# Patient Record
Sex: Male | Born: 1955 | Race: White | Hispanic: No | Marital: Married | Smoking: Current every day smoker
Health system: Southern US, Community
[De-identification: ages and names within clinical notes are randomized; demographics above are authoritative.]

## PROBLEM LIST (undated history)

## (undated) DIAGNOSIS — I10 Essential (primary) hypertension: Secondary | ICD-10-CM

## (undated) DIAGNOSIS — N280 Ischemia and infarction of kidney: Secondary | ICD-10-CM

## (undated) DIAGNOSIS — G459 Transient cerebral ischemic attack, unspecified: Secondary | ICD-10-CM

## (undated) DIAGNOSIS — K55069 Acute infarction of intestine, part and extent unspecified: Secondary | ICD-10-CM

## (undated) DIAGNOSIS — I639 Cerebral infarction, unspecified: Secondary | ICD-10-CM

---

## 1985-08-27 HISTORY — PX: CYST EXCISION: SHX5701

## 2004-08-31 ENCOUNTER — Ambulatory Visit: Payer: Self-pay | Admitting: Family Medicine

## 2004-09-13 ENCOUNTER — Ambulatory Visit: Payer: Self-pay | Admitting: Family Medicine

## 2004-12-19 ENCOUNTER — Ambulatory Visit: Payer: Self-pay | Admitting: Family Medicine

## 2005-10-02 ENCOUNTER — Ambulatory Visit: Payer: Self-pay | Admitting: Family Medicine

## 2005-11-19 ENCOUNTER — Ambulatory Visit: Payer: Self-pay | Admitting: Family Medicine

## 2016-01-25 ENCOUNTER — Inpatient Hospital Stay
Admission: EM | Admit: 2016-01-25 | Payer: Self-pay | Source: Other Acute Inpatient Hospital | Admitting: Family Medicine

## 2016-01-26 DIAGNOSIS — G459 Transient cerebral ischemic attack, unspecified: Secondary | ICD-10-CM

## 2016-01-26 HISTORY — DX: Transient cerebral ischemic attack, unspecified: G45.9

## 2016-01-30 ENCOUNTER — Inpatient Hospital Stay (HOSPITAL_COMMUNITY): Payer: BLUE CROSS/BLUE SHIELD | Admitting: Anesthesiology

## 2016-01-30 ENCOUNTER — Encounter (HOSPITAL_COMMUNITY): Payer: Self-pay | Admitting: *Deleted

## 2016-01-30 ENCOUNTER — Encounter (HOSPITAL_COMMUNITY): Admission: AD | Disposition: E | Payer: Self-pay | Source: Other Acute Inpatient Hospital | Attending: Vascular Surgery

## 2016-01-30 ENCOUNTER — Inpatient Hospital Stay (HOSPITAL_COMMUNITY)
Admission: AD | Admit: 2016-01-30 | Discharge: 2016-02-25 | DRG: 329 | Disposition: E | Payer: BLUE CROSS/BLUE SHIELD | Source: Other Acute Inpatient Hospital | Attending: Vascular Surgery | Admitting: Vascular Surgery

## 2016-01-30 DIAGNOSIS — I739 Peripheral vascular disease, unspecified: Secondary | ICD-10-CM | POA: Diagnosis present

## 2016-01-30 DIAGNOSIS — R601 Generalized edema: Secondary | ICD-10-CM | POA: Diagnosis not present

## 2016-01-30 DIAGNOSIS — R609 Edema, unspecified: Secondary | ICD-10-CM

## 2016-01-30 DIAGNOSIS — I10 Essential (primary) hypertension: Secondary | ICD-10-CM | POA: Diagnosis present

## 2016-01-30 DIAGNOSIS — I9581 Postprocedural hypotension: Secondary | ICD-10-CM | POA: Diagnosis not present

## 2016-01-30 DIAGNOSIS — K55021 Focal (segmental) acute infarction of small intestine: Principal | ICD-10-CM | POA: Diagnosis present

## 2016-01-30 DIAGNOSIS — R4020 Unspecified coma: Secondary | ICD-10-CM | POA: Diagnosis not present

## 2016-01-30 DIAGNOSIS — D735 Infarction of spleen: Secondary | ICD-10-CM | POA: Diagnosis present

## 2016-01-30 DIAGNOSIS — I63343 Cerebral infarction due to thrombosis of bilateral cerebellar arteries: Secondary | ICD-10-CM | POA: Diagnosis not present

## 2016-01-30 DIAGNOSIS — J189 Pneumonia, unspecified organism: Secondary | ICD-10-CM | POA: Diagnosis not present

## 2016-01-30 DIAGNOSIS — I63412 Cerebral infarction due to embolism of left middle cerebral artery: Secondary | ICD-10-CM | POA: Diagnosis not present

## 2016-01-30 DIAGNOSIS — I6522 Occlusion and stenosis of left carotid artery: Secondary | ICD-10-CM | POA: Diagnosis present

## 2016-01-30 DIAGNOSIS — K55059 Acute (reversible) ischemia of intestine, part and extent unspecified: Secondary | ICD-10-CM

## 2016-01-30 DIAGNOSIS — E8770 Fluid overload, unspecified: Secondary | ICD-10-CM | POA: Diagnosis not present

## 2016-01-30 DIAGNOSIS — I749 Embolism and thrombosis of unspecified artery: Secondary | ICD-10-CM | POA: Diagnosis not present

## 2016-01-30 DIAGNOSIS — M7989 Other specified soft tissue disorders: Secondary | ICD-10-CM | POA: Diagnosis not present

## 2016-01-30 DIAGNOSIS — Z6823 Body mass index (BMI) 23.0-23.9, adult: Secondary | ICD-10-CM

## 2016-01-30 DIAGNOSIS — D638 Anemia in other chronic diseases classified elsewhere: Secondary | ICD-10-CM | POA: Diagnosis present

## 2016-01-30 DIAGNOSIS — F17210 Nicotine dependence, cigarettes, uncomplicated: Secondary | ICD-10-CM | POA: Diagnosis present

## 2016-01-30 DIAGNOSIS — Z66 Do not resuscitate: Secondary | ICD-10-CM | POA: Diagnosis not present

## 2016-01-30 DIAGNOSIS — G9340 Encephalopathy, unspecified: Secondary | ICD-10-CM | POA: Diagnosis present

## 2016-01-30 DIAGNOSIS — E43 Unspecified severe protein-calorie malnutrition: Secondary | ICD-10-CM | POA: Diagnosis not present

## 2016-01-30 DIAGNOSIS — E874 Mixed disorder of acid-base balance: Secondary | ICD-10-CM | POA: Diagnosis not present

## 2016-01-30 DIAGNOSIS — I7389 Other specified peripheral vascular diseases: Secondary | ICD-10-CM | POA: Diagnosis not present

## 2016-01-30 DIAGNOSIS — K659 Peritonitis, unspecified: Secondary | ICD-10-CM | POA: Diagnosis present

## 2016-01-30 DIAGNOSIS — D696 Thrombocytopenia, unspecified: Secondary | ICD-10-CM | POA: Diagnosis present

## 2016-01-30 DIAGNOSIS — I639 Cerebral infarction, unspecified: Secondary | ICD-10-CM | POA: Diagnosis present

## 2016-01-30 DIAGNOSIS — N280 Ischemia and infarction of kidney: Secondary | ICD-10-CM | POA: Diagnosis present

## 2016-01-30 DIAGNOSIS — R0989 Other specified symptoms and signs involving the circulatory and respiratory systems: Secondary | ICD-10-CM

## 2016-01-30 DIAGNOSIS — Z823 Family history of stroke: Secondary | ICD-10-CM

## 2016-01-30 DIAGNOSIS — E785 Hyperlipidemia, unspecified: Secondary | ICD-10-CM | POA: Diagnosis present

## 2016-01-30 DIAGNOSIS — K55069 Acute infarction of intestine, part and extent unspecified: Secondary | ICD-10-CM | POA: Diagnosis present

## 2016-01-30 DIAGNOSIS — J9600 Acute respiratory failure, unspecified whether with hypoxia or hypercapnia: Secondary | ICD-10-CM | POA: Diagnosis not present

## 2016-01-30 DIAGNOSIS — R233 Spontaneous ecchymoses: Secondary | ICD-10-CM | POA: Diagnosis not present

## 2016-01-30 DIAGNOSIS — J9601 Acute respiratory failure with hypoxia: Secondary | ICD-10-CM | POA: Insufficient documentation

## 2016-01-30 DIAGNOSIS — E876 Hypokalemia: Secondary | ICD-10-CM | POA: Diagnosis not present

## 2016-01-30 DIAGNOSIS — K551 Chronic vascular disorders of intestine: Secondary | ICD-10-CM | POA: Diagnosis present

## 2016-01-30 DIAGNOSIS — Z419 Encounter for procedure for purposes other than remedying health state, unspecified: Secondary | ICD-10-CM

## 2016-01-30 DIAGNOSIS — Z7982 Long term (current) use of aspirin: Secondary | ICD-10-CM | POA: Diagnosis not present

## 2016-01-30 DIAGNOSIS — D72829 Elevated white blood cell count, unspecified: Secondary | ICD-10-CM | POA: Diagnosis not present

## 2016-01-30 DIAGNOSIS — R4189 Other symptoms and signs involving cognitive functions and awareness: Secondary | ICD-10-CM | POA: Insufficient documentation

## 2016-01-30 DIAGNOSIS — Z8673 Personal history of transient ischemic attack (TIA), and cerebral infarction without residual deficits: Secondary | ICD-10-CM

## 2016-01-30 DIAGNOSIS — J9690 Respiratory failure, unspecified, unspecified whether with hypoxia or hypercapnia: Secondary | ICD-10-CM

## 2016-01-30 DIAGNOSIS — I63443 Cerebral infarction due to embolism of bilateral cerebellar arteries: Secondary | ICD-10-CM | POA: Diagnosis not present

## 2016-01-30 DIAGNOSIS — Z452 Encounter for adjustment and management of vascular access device: Secondary | ICD-10-CM

## 2016-01-30 DIAGNOSIS — Z9289 Personal history of other medical treatment: Secondary | ICD-10-CM

## 2016-01-30 DIAGNOSIS — I63031 Cerebral infarction due to thrombosis of right carotid artery: Secondary | ICD-10-CM | POA: Diagnosis not present

## 2016-01-30 DIAGNOSIS — Z7189 Other specified counseling: Secondary | ICD-10-CM | POA: Diagnosis not present

## 2016-01-30 DIAGNOSIS — R29898 Other symptoms and signs involving the musculoskeletal system: Secondary | ICD-10-CM

## 2016-01-30 DIAGNOSIS — I6529 Occlusion and stenosis of unspecified carotid artery: Secondary | ICD-10-CM | POA: Insufficient documentation

## 2016-01-30 DIAGNOSIS — I6789 Other cerebrovascular disease: Secondary | ICD-10-CM | POA: Diagnosis not present

## 2016-01-30 DIAGNOSIS — R404 Transient alteration of awareness: Secondary | ICD-10-CM | POA: Diagnosis not present

## 2016-01-30 DIAGNOSIS — Z515 Encounter for palliative care: Secondary | ICD-10-CM | POA: Insufficient documentation

## 2016-01-30 DIAGNOSIS — K55019 Acute (reversible) ischemia of small intestine, extent unspecified: Secondary | ICD-10-CM | POA: Diagnosis not present

## 2016-01-30 DIAGNOSIS — R109 Unspecified abdominal pain: Secondary | ICD-10-CM | POA: Diagnosis present

## 2016-01-30 DIAGNOSIS — Z9889 Other specified postprocedural states: Secondary | ICD-10-CM

## 2016-01-30 DIAGNOSIS — M79609 Pain in unspecified limb: Secondary | ICD-10-CM | POA: Diagnosis not present

## 2016-01-30 HISTORY — PX: LAPAROTOMY: SHX154

## 2016-01-30 HISTORY — DX: Cerebral infarction, unspecified: I63.9

## 2016-01-30 HISTORY — DX: Transient cerebral ischemic attack, unspecified: G45.9

## 2016-01-30 HISTORY — DX: Acute infarction of intestine, part and extent unspecified: K55.069

## 2016-01-30 HISTORY — DX: Essential (primary) hypertension: I10

## 2016-01-30 HISTORY — DX: Ischemia and infarction of kidney: N28.0

## 2016-01-30 HISTORY — PX: BOWEL RESECTION: SHX1257

## 2016-01-30 LAB — COMPREHENSIVE METABOLIC PANEL
ALT: 34 U/L (ref 17–63)
AST: 28 U/L (ref 15–41)
Albumin: 2.9 g/dL — ABNORMAL LOW (ref 3.5–5.0)
Alkaline Phosphatase: 70 U/L (ref 38–126)
Anion gap: 10 (ref 5–15)
BUN: 18 mg/dL (ref 6–20)
CO2: 21 mmol/L — ABNORMAL LOW (ref 22–32)
Calcium: 8.7 mg/dL — ABNORMAL LOW (ref 8.9–10.3)
Chloride: 102 mmol/L (ref 101–111)
Creatinine, Ser: 0.86 mg/dL (ref 0.61–1.24)
GFR calc Af Amer: >60 mL/min (ref >60)
GFR calc non Af Amer: >60 mL/min (ref >60)
Glucose, Bld: 108 mg/dL — ABNORMAL HIGH (ref 65–99)
Potassium: 3.6 mmol/L (ref 3.5–5.1)
Sodium: 133 mmol/L — ABNORMAL LOW (ref 135–145)
Total Bilirubin: 0.9 mg/dL (ref 0.3–1.2)
Total Protein: 7.3 g/dL (ref 6.5–8.1)

## 2016-01-30 LAB — CBC
HCT: 36.3 % — ABNORMAL LOW (ref 39.0–52.0)
Hemoglobin: 12.4 g/dL — ABNORMAL LOW (ref 13.0–17.0)
MCH: 30.6 pg (ref 26.0–34.0)
MCHC: 34.2 g/dL (ref 30.0–36.0)
MCV: 89.6 fL (ref 78.0–100.0)
PLATELETS: 241 10*3/uL (ref 150–400)
RBC: 4.05 MIL/uL — AB (ref 4.22–5.81)
RDW: 12.6 % (ref 11.5–15.5)
WBC: 18.3 10*3/uL — AB (ref 4.0–10.5)

## 2016-01-30 LAB — LACTIC ACID, PLASMA: Lactic Acid, Venous: 0.8 mmol/L (ref 0.5–2.0)

## 2016-01-30 SURGERY — LAPAROTOMY, EXPLORATORY
Anesthesia: General | Site: Abdomen

## 2016-01-30 MED ORDER — NITROGLYCERIN IN D5W 200-5 MCG/ML-% IV SOLN
0.0000 ug/min | INTRAVENOUS | Status: AC
  Filled 2016-01-30: qty 250

## 2016-01-30 MED ORDER — DEXMEDETOMIDINE HCL IN NACL 200 MCG/50ML IV SOLN
0.4000 ug/kg/h | INTRAVENOUS | Status: AC
Start: 2016-01-30 — End: 2016-01-31
  Administered 2016-01-31: 0.7 ug/kg/h via INTRAVENOUS
  Filled 2016-01-30: qty 50

## 2016-01-30 MED ORDER — FENTANYL CITRATE (PF) 250 MCG/5ML IJ SOLN
INTRAMUSCULAR | Status: AC
  Filled 2016-01-30: qty 5

## 2016-01-30 MED ORDER — ACETAMINOPHEN 325 MG PO TABS: 650.0000 mg | ORAL_TABLET | Freq: Four times a day (QID) | ORAL | Status: DC | PRN

## 2016-01-30 MED ORDER — SODIUM CHLORIDE 0.9% FLUSH
3.0000 mL | Freq: Two times a day (BID) | INTRAVENOUS | Status: DC
  Administered 2016-01-31: 3 mL via INTRAVENOUS
  Administered 2016-02-02: 10 mL via INTRAVENOUS
  Administered 2016-02-03: 3 mL via INTRAVENOUS
  Administered 2016-02-03: 10 mL via INTRAVENOUS
  Administered 2016-02-04 – 2016-02-08 (×6): 3 mL via INTRAVENOUS

## 2016-01-30 MED ORDER — ASPIRIN 300 MG RE SUPP
300.0000 mg | Freq: Every day | RECTAL | Status: DC
  Administered 2016-01-31 – 2016-02-08 (×8): 300 mg via RECTAL
  Filled 2016-01-30 (×8): qty 1

## 2016-01-30 MED ORDER — SODIUM CHLORIDE 0.9 % IV SOLN
INTRAVENOUS | Status: DC
  Administered 2016-01-30: 22:00:00 via INTRAVENOUS

## 2016-01-30 MED ORDER — ONDANSETRON HCL 4 MG/2ML IJ SOLN: 4.0000 mg | Freq: Four times a day (QID) | INTRAMUSCULAR | Status: DC | PRN

## 2016-01-30 MED ORDER — HYDROMORPHONE HCL 1 MG/ML IJ SOLN
0.5000 mg | INTRAMUSCULAR | Status: DC | PRN
Start: 2016-01-30 — End: 2016-02-04

## 2016-01-30 MED ORDER — MIDAZOLAM HCL 2 MG/2ML IJ SOLN
INTRAMUSCULAR | Status: AC
  Filled 2016-01-30: qty 2

## 2016-01-30 MED ORDER — ONDANSETRON HCL 4 MG PO TABS: 4.0000 mg | ORAL_TABLET | Freq: Four times a day (QID) | ORAL | Status: DC | PRN

## 2016-01-30 MED ORDER — ROCURONIUM BROMIDE 50 MG/5ML IV SOLN
INTRAVENOUS | Status: AC
  Filled 2016-01-30: qty 2

## 2016-01-30 MED ORDER — ARTIFICIAL TEARS OP OINT
TOPICAL_OINTMENT | OPHTHALMIC | Status: AC
  Filled 2016-01-30: qty 7

## 2016-01-30 MED ORDER — PROPOFOL 10 MG/ML IV BOLUS
INTRAVENOUS | Status: AC
  Filled 2016-01-30: qty 20

## 2016-01-30 MED ORDER — ACETAMINOPHEN 650 MG RE SUPP
650.0000 mg | Freq: Four times a day (QID) | RECTAL | Status: DC | PRN
  Administered 2016-02-05 – 2016-02-06 (×2): 650 mg via RECTAL
  Filled 2016-01-30 (×2): qty 1

## 2016-01-30 SURGICAL SUPPLY — 64 items
ATTRACTOMAT 16X20 MAGNETIC DRP (DRAPES) ×4 IMPLANT
BENZOIN TINCTURE PRP APPL 2/3 (GAUZE/BANDAGES/DRESSINGS) ×8 IMPLANT
CANISTER SUCTION 2500CC (MISCELLANEOUS) ×4 IMPLANT
CANISTER WOUND CARE 500ML ATS (WOUND CARE) ×4 IMPLANT
CANNULA VESSEL 3MM 2 BLNT TIP (CANNULA) ×4 IMPLANT
CLIP LIGATING EXTRA MED SLVR (CLIP) ×4 IMPLANT
CLIP LIGATING EXTRA SM BLUE (MISCELLANEOUS) ×4 IMPLANT
CLOSURE WOUND 1/2 X4 (GAUZE/BANDAGES/DRESSINGS)
COVER TABLE BACK 60X90 (DRAPES) ×4 IMPLANT
DRAPE BILATERAL SPLIT (DRAPES) IMPLANT
DRAPE CV SPLIT W-CLR ANES SCRN (DRAPES) ×4 IMPLANT
ELECT BLADE 4.0 EZ CLEAN MEGAD (MISCELLANEOUS) ×4
ELECT REM PT RETURN 9FT ADLT (ELECTROSURGICAL) ×4
ELECTRODE BLDE 4.0 EZ CLN MEGD (MISCELLANEOUS) ×2 IMPLANT
ELECTRODE REM PT RTRN 9FT ADLT (ELECTROSURGICAL) ×2 IMPLANT
GLOVE BIO SURGEON STRL SZ7 (GLOVE) ×4 IMPLANT
GLOVE BIO SURGEON STRL SZ8 (GLOVE) ×4 IMPLANT
GLOVE BIOGEL PI IND STRL 6.5 (GLOVE) ×8 IMPLANT
GLOVE BIOGEL PI IND STRL 7.0 (GLOVE) ×2 IMPLANT
GLOVE BIOGEL PI IND STRL 8 (GLOVE) ×2 IMPLANT
GLOVE BIOGEL PI INDICATOR 6.5 (GLOVE) ×8
GLOVE BIOGEL PI INDICATOR 7.0 (GLOVE) ×2
GLOVE BIOGEL PI INDICATOR 8 (GLOVE) ×2
GLOVE SS BIOGEL STRL SZ 6.5 (GLOVE) ×2 IMPLANT
GLOVE SS BIOGEL STRL SZ 7.5 (GLOVE) ×2 IMPLANT
GLOVE SUPERSENSE BIOGEL SZ 6.5 (GLOVE) ×2
GLOVE SUPERSENSE BIOGEL SZ 7.5 (GLOVE) ×2
GOWN STRL REUS W/ TWL LRG LVL3 (GOWN DISPOSABLE) ×8 IMPLANT
GOWN STRL REUS W/TWL LRG LVL3 (GOWN DISPOSABLE) ×8
INSERT FOGARTY 61MM (MISCELLANEOUS) ×8 IMPLANT
INSERT FOGARTY SM (MISCELLANEOUS) ×8 IMPLANT
KIT BASIN OR (CUSTOM PROCEDURE TRAY) ×4 IMPLANT
KIT ROOM TURNOVER OR (KITS) ×4 IMPLANT
LIGASURE IMPACT 36 18CM CVD LR (INSTRUMENTS) ×4 IMPLANT
NS IRRIG 1000ML POUR BTL (IV SOLUTION) ×8 IMPLANT
PACK AORTA (CUSTOM PROCEDURE TRAY) ×4 IMPLANT
PAD ARMBOARD 7.5X6 YLW CONV (MISCELLANEOUS) ×8 IMPLANT
RELOAD PROXIMATE 75MM BLUE (ENDOMECHANICALS) ×4 IMPLANT
SPONGE ABDOMINAL VAC ABTHERA (MISCELLANEOUS) ×4 IMPLANT
SPONGE LAP 18X18 X RAY DECT (DISPOSABLE) IMPLANT
STAPLER PROXIMATE 75MM BLUE (STAPLE) ×4 IMPLANT
STAPLER VISISTAT 35W (STAPLE) IMPLANT
STRIP CLOSURE SKIN 1/2X4 (GAUZE/BANDAGES/DRESSINGS) IMPLANT
SUCTION POOLE TIP (SUCTIONS) ×4 IMPLANT
SUT ETHIBOND 5 LR DA (SUTURE) IMPLANT
SUT PDS AB 1 TP1 54 (SUTURE) IMPLANT
SUT PROLENE 3 0 SH1 36 (SUTURE) IMPLANT
SUT PROLENE 5 0 C 1 24 (SUTURE) IMPLANT
SUT PROLENE 5 0 C 1 36 (SUTURE) ×8 IMPLANT
SUT SILK 2 0 (SUTURE) ×2
SUT SILK 2 0 SH CR/8 (SUTURE) ×4 IMPLANT
SUT SILK 2 0 TIES 17X18 (SUTURE) ×2
SUT SILK 2-0 18XBRD TIE 12 (SUTURE) ×2 IMPLANT
SUT SILK 2-0 18XBRD TIE BLK (SUTURE) ×2 IMPLANT
SUT SILK 3 0 (SUTURE) ×2
SUT SILK 3 0 TIES 17X18 (SUTURE) ×2
SUT SILK 3-0 18XBRD TIE 12 (SUTURE) ×2 IMPLANT
SUT SILK 3-0 18XBRD TIE BLK (SUTURE) ×2 IMPLANT
SUT VIC AB 2-0 CT1 36 (SUTURE) IMPLANT
SUT VIC AB 3-0 SH 27 (SUTURE)
SUT VIC AB 3-0 SH 27X BRD (SUTURE) IMPLANT
TOWEL BLUE STERILE X RAY DET (MISCELLANEOUS) ×4 IMPLANT
TRAY FOLEY W/METER SILVER 16FR (SET/KITS/TRAYS/PACK) ×4 IMPLANT
WATER STERILE IRR 1000ML POUR (IV SOLUTION) ×8 IMPLANT

## 2016-01-30 NOTE — H&P (Signed)
Brian Baxter EXB:[id number] DOB: [birth year] DOA: 02/14/2016     PCP: Dina Rich, MD   Outpatient Specialists: none  Patient coming from:    home Lives  With family    Chief Complaint: abdominal Pain  HPI: Brian Baxter is a 60 y.o. 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 He has undergone echogram, TEE showing no source of emboli MRI of the pelvis at that time showed no evidence of vascular occlusion. Carotid ultrasound showed bilateral bifurcation plaque but no evidence of hemodynamically significant stenosis, venous Dopplers of lower extremities show no DVT.  Patient was discharge to home on aspirin 81 and Lipitor. Yesterday night on June 4 he developed significant abdominal pain today he presented to [facility] CT A of abdomen and pelvis showed embolus and superior mesenteric artery for possible small bowel ischemia in the left lower quadrant infarcts in the spleen left kidney and possibly in the lower pole of the right kidney. He was transferred to [facility] for interventional radiology consult in order to have TPA administered. He was started on heparin drip and Route to [facility] On arrival to [facility] IR has been consulted but felt that secondary to CVA patient was not a candidate for TPA. At that point vascular surgery was consulted. Plan this point to continue heparin and vascular surgery will evaluate tonight.  Patient is point reports his abdominal pain is tolerable rated at 5 out of 10 he denies any blood in stool or recent bleeding.   Hospitalist was called for admission for acute SMA embolism if possible ischemic bowel,  spleen and kidneys  Review of Systems:    Pertinent positives include: Abdominal pain, lightheadedness  Constitutional:  No weight loss, night sweats, Fevers, chills, fatigue, weight loss  HEENT:  No headaches, Difficulty swallowing,Tooth/dental problems,Sore throat,  No sneezing, itching, ear ache, nasal congestion, post nasal drip,  Cardio-vascular:  No chest pain, Orthopnea, PND, anasarca, dizziness, palpitations.no Bilateral lower extremity swelling  GI:  No heartburn, indigestion,   nausea, vomiting, diarrhea, change in bowel habits, loss of appetite, melena, blood in stool, hematemesis Resp:  no shortness of breath at rest. No dyspnea on exertion, No excess mucus, no productive cough, No non-productive cough, No coughing up of blood.No change in color of mucus.No wheezing. Skin:  no rash or lesions. No jaundice GU:  no dysuria, change in color of urine, no urgency or frequency. No straining to urinate.  No flank pain.  Musculoskeletal:  No joint pain or no joint swelling. No decreased range of motion. No back pain.  Psych:  No change in mood or affect. No depression or anxiety. No memory loss.  Neuro: no localizing neurological complaints, no tingling, no weakness, no double vision, no gait abnormality, no slurred speech, no confusion  As per HPI otherwise 10 point review of systems negative.   Past Medical History: Past Medical History  Diagnosis Date  . Hypertension   . TIA (transient ischemic attack) june 2017  . Renal infarct (HCC) 02/03/2016   Past Surgical History  Procedure Laterality Date  . Cyst excision Left 1987    testicle  Social History:  Ambulatory  independently      reports that he has been smoking Cigarettes.  He has a 30 pack-year smoking history. He has never used smokeless tobacco. He reports that he drinks alcohol. His drug history is not on file.  Allergies:  Allergies not on file     Family History:    Family History  Problem  Relation Age of Onset  . Stroke Father     Medications: Prior to Admission medications   Not on File    Physical Exam: Patient Vitals for the past 24 hrs:  BP Temp Temp src Pulse Resp SpO2 Height  02/15/2016 2040 114/73 mmHg 98.2 F (36.8 C) Oral 86 (!) 24 91 % 5\' 9"  (1.753 m)    1. General:  in No Acute distress 2. Psychological: Alert and  Oriented 3. Head/ENT:   Moist   Mucous Membranes                          Head Non traumatic, neck supple                          Normal  Dentition 4. SKIN:   decreased Skin turgor,  Skin clean Dry and intact no rash 5. Heart: Regular rate and rhythm no  Murmur, Rub or gallop 6. Lungs:  Clear to auscultation bilaterally, no wheezes or crackles   7. Abdomen: Diffusely tender,  distended 8. Lower extremities: no clubbing, cyanosis, or edema 9. Neurologically Grossly intact, moving all 4 extremities equally 10. MSK: Normal range of motion   body mass index is unknown because there is no weight on file.  Labs on Admission:   Labs on Admission: I have personally reviewed following labs and imaging studies  CBC: No results for input(s): WBC, NEUTROABS, HGB, HCT, MCV, PLT in the last 168 hours. Basic Metabolic Panel: No results for input(s): NA, K, CL, CO2, GLUCOSE, BUN, CREATININE, CALCIUM, MG, PHOS in the last 168 hours. GFR: CrCl cannot be calculated (Unknown ideal weight.). Liver Function Tests: No results for input(s): AST, ALT, ALKPHOS, BILITOT, PROT, ALBUMIN in the last 168 hours. No results for input(s): LIPASE, AMYLASE in the last 168 hours. No results for input(s): AMMONIA in the last 168 hours. Coagulation Profile: No results for input(s): INR, PROTIME in the last 168 hours. Cardiac Enzymes: No results for input(s): CKTOTAL, CKMB, CKMBINDEX, TROPONINI in the last 168 hours. BNP (last 3 results) No results for input(s): PROBNP in the last 8760 hours. HbA1C: No results for input(s): HGBA1C in the last 72 hours. CBG: No  results for input(s): GLUCAP in the last 168 hours. Lipid Profile: No results for input(s): CHOL, HDL, LDLCALC, TRIG, CHOLHDL, LDLDIRECT in the last 72 hours. Thyroid Function Tests: No results for input(s): TSH, T4TOTAL, FREET4, T3FREE, THYROIDAB in the last 72 hours. Anemia Panel: No results for input(s): VITAMINB12, FOLATE, FERRITIN, TIBC, IRON, RETICCTPCT in the last 72 hours. Urine analysis: No results found for: COLORURINE, APPEARANCEUR, LABSPEC, PHURINE, GLUCOSEU, HGBUR, BILIRUBINUR, KETONESUR, PROTEINUR, UROBILINOGEN, NITRITE, LEUKOCYTESUR Sepsis Labs: @LABRCNTIP (procalcitonin:4,lacticidven:4) )No results found for this or any previous visit (from the past 240 hour(s)).     Lactic acid 1.0 at outside facility  UA  no evidence of UTI  No results found for: HGBA1C  CrCl cannot be calculated (Unknown ideal weight.).  BNP (last 3 results) No results for input(s): PROBNP in the last 8760 hours.   ECG REPORT outside  facility  Independently reviewed Rate: 75  Rhythm: Normal sinus rhythm ST&T Change: No acute ischemic changes   QTC 402  There were no vitals filed for this visit.   Cultures: No results found for: SDES, SPECREQUEST, CULT, REPTSTATUS   Radiological Exams on Admission: No results found.  Chart has been reviewed    Assessment/Plan  60 y.o. 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 Superior mesenteric artery thrombosis (HCC) -appreciate vascular surgery consult keep patient patient nothing by mouth, follow lactic acid  . Splenic infarction - as per vascular surgery will take to OR, with general surgery on call to OR as well.  . Renal infarct (HCC) - follow creatinine  . Essential hypertension - hold home medications at this point  . CVA (cerebral vascular accident) [facility])  - discuss with neurology we'll continue heparin for now discussed with family disability of increase hemorrhagic conversion but given diffuse embolic events will choose to continue heparin , Aspirin 300 rectally while NPO  . Embolic infarction (HCC) continue heparin    Other plan as per orders.  DVT prophylaxis:  heparin   Code Status:  FULL CODE  as per patient , guarded prognosis  Family Communication:   Family  at  Bedside  plan of care was discussed with   Drayk Humbarger [phone]  Disposition Plan:   likely will need placement for rehabilitation                            Consults called: discussed with  IR, discussed with  Neurology, consulted Vascular Surgery,   Admission status:   inpatient       Level of care     SDU        Yanky Vanderburg 02/02/2016, 10:06 PM    [facility]  Pager [phone]   after 2 AM please page floor coverage PA If 7AM-7PM, please contact the day team taking care of the patient  [email or web address]  Password [email or web address]

## 2016-01-30 NOTE — Consult Note (Signed)
ANTICOAGULATION CONSULT NOTE - Initial Consult  Pharmacy Consult for Heparin Indication: Emboli to SMA/kidney/spleen, acute CVA  Allergies  Allergen Reactions  . Penicillins Hives    Patient Measurements: Height: 5\' 9"  (175.3 cm) Weight: 164 lb 3.9 oz (74.5 kg) IBW/kg (Calculated) : 70.7  Vital Signs: Temp: 98.2 F (36.8 C) (06/05 2040) Temp Source: Oral (06/05 2040) BP: 114/73 mmHg (06/05 2040) Pulse Rate: 86 (06/05 2040)  Labs: No results for input(s): HGB, HCT, PLT, APTT, LABPROT, INR, HEPARINUNFRC, HEPRLOWMOCWT, CREATININE, CKTOTAL, CKMB, TROPONINI in the last 72 hours.  CrCl cannot be calculated (Patient has no serum creatinine result on file.).   Medical History: Past Medical History  Diagnosis Date  . Hypertension   . TIA (transient ischemic attack) june 2017  . Renal infarct (HCC) 02/17/2016  . CVA (cerebral infarction)   . Superior mesenteric artery thrombosis (HCC) 02/07/2016    Medications:  Heparin @ 800 units/hr (from [facility])  Assessment: 59yom found to have [facility] today with abdominal pain. CTA abdomen showed embolus to his superior mesenteric artery with concern for small bowel ischemia, as well as infarcts to his kidney and spleen. Heparin gtt was started ~ 1400 (appears he got a 4000 unit bolus and now running at 800 units/hr). Vascular surgery consult pending.  Goal of Therapy:  Heparin level 0.3-0.5 units/ml Monitor platelets by anticoagulation protocol: Yes   Plan:  1) Check STAT heparin level and adjust accordingly  Fredrik RiggerMarkle, Dymin Dingledine Sue 01/31/2016,10:12 PM

## 2016-01-30 NOTE — Consult Note (Signed)
Patient name: Brian Baxter MRN: [id number] DOB: 23-Mar-1956 Sex: male   Referred by: [facility]    HISTORY OF PRESENT ILLNESS: Patient is very complex 60 year old gentleman presenting with mesenteric ischemia. He had been in his usual state of health when he had a stroke. He was admitted to [facility] on 531 where MRI showed bilateral anterior and posterior circulation strokes. Workup including transesophageal echo showed no evidence of embolic source. He was discharged home on aspirin. He presented [facility] yesterday complaining of worsening abdominal pain. CT scan today revealed infarcts to his left kidney and his spleen. 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 The plan had been to transfer him to [facility] for interventional radiology for lysis of the SMA occlusion. When it was discovered that he had the recent stroke vascular surgery was consult. The patient denies any prior history of chronic mesenteric ischemia symptoms such as postprandial pain or abdominal pain. Does report a possible calf claudication reports that his calves get tired with walking. No prior cardiac history.  Past Medical History  Diagnosis Date  . Hypertension   . TIA (transient ischemic attack) june 2017  . Renal infarct (HCC) 01/26/2016  . CVA (cerebral infarction)   . Superior mesenteric artery thrombosis (HCC) 02/21/2016    Past Surgical History  Procedure Laterality Date  . Cyst excision Left 1987    testicle    Social History   Social History  . Marital Status: Married    Spouse Name: N/A  . Number of Children: N/A  . Years of Education: N/A   Occupational History  . Not on file.   Social History Main Topics  . Smoking status: Current Every Day Smoker -- 1.00 packs/day for 30 years    Types: Cigarettes  . Smokeless tobacco: Never Used  . Alcohol Use: Yes     Comment: monthly  . Drug Use: No  . Sexual Activity: Yes    Birth  Control/ Protection: None   Other Topics Concern  . Not on file   Social History Narrative  . No narrative on file    Family History  Problem Relation Age of Onset  . Stroke Father     Allergies as of 02/02/2016 - Review Complete 02/19/2016  Allergen Reaction Noted  . Penicillins Hives 02/05/2016    No current facility-administered medications on file prior to encounter.   No current outpatient prescriptions on file prior to encounter.     REVIEW OF SYSTEMS: Reviewed in his history and physical with nothing to add   PHYSICAL EXAMINATION:  General: The patient is a well-nourished male, in mild distress due to abdominal pain Vital signs are BP 114/73 mmHg  Pulse 86  Temp(Src) 98.2 F (36.8 C) (Oral)  Resp 24  Ht  (1.753 m)  Wt 164 lb 3.9 oz (74.5 kg)  BMI 24.24 kg/m2  SpO2 91% Pulmonary: There is a good air exchange  Abdomen: Soft but diffusely tender and slightly distended. Tender to palpation in all quadrants and also tender when shaking his hips. Musculoskeletal: There are no major deformities.  There is no significant extremity pain. Neurologic: No focal weakness or paresthesias are detected, Skin: There are no ulcer or rashes noted. Psychiatric: The patient has normal affect. Cardiovascular: There is a regular rate and rhythm without significant murmur appreciated.   CT scan of his abdomen and pelvis was reviewed. 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 Does have thrombosis of his superior mesenteric artery distal to the high-grade proximal stenosis. His aorta and both iliac arteries are extremely calcified as well. He does have infarct in his left kidney and spleen.  Impression and Plan:  Extremely complex problem with a very difficult management possibilities. Not a candidate for endovascular intervention due to the extremely calcified high-grade subtotal occlusions of his proximal superior  mesenteric, celiac and inferior mesenteric artery. Appears that though he has had thrombosis of chronic stenosis in his superior mesenteric artery. Would not be possible to embolize past the second critical stenosis which is chronic. Quite concerned regarding viability of his intestine with a severe abdominal pain. Have consult with Dr. Violeta GelinasBurke Thompson with general surgery as well. Will be taken immediately to the operating room for exploration and hopeful bypass. Explained to the patient and his family and critical nature of this and that this is certainly a life-threatening situation. Unfortunately there is no single source that explains his cerebral infarcts, splenic infarct and left kidney infarct and thrombosis of his spare mesenteric artery. This would most certainly have to be cardiac source but this has been evaluated and no source was noted. Neurology feels he would be too high risk for ongoing heparinization. Would have to be heparinized if bypasses to be undertaken. Explained that at exploration we would revascularize his inferior mesenteric artery and this may require aortobifemoral bypass for ability to have inflow.    Gretta BeganEarly, Milferd Ansell [facility] Office: [phone]

## 2016-01-30 NOTE — Consult Note (Signed)
Reason for Consult:mesenteric ischemia Referring Physician: Caprice Wasko is an 60 y.o. male.  HPI: Jaquari recently suffered bilateral embolic CVA. He underwent workup for that and was treated with aspirin. 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 [facility] with plans for possible interventional radiology lysis.In light of his recent CVA that was not possible. He was seen by Dr. Arbie Cookey from [facility] plans emergent exploration and possible vascular bypass. In light of his abdominal pain and tenderness he has been seen in consultation as there is concern for bowel ischemia.  Past Medical History  Diagnosis Date  . Hypertension   . TIA (transient ischemic attack) june 2017  . Renal infarct (HCC) 01/29/2016  . CVA (cerebral infarction)   . Superior mesenteric artery thrombosis (HCC) 02/19/2016    Past Surgical History  Procedure Laterality Date  . Cyst excision Left 1987    testicle    Family History  Problem Relation Age of Onset  . Stroke Father     Social History:  reports that he has been smoking Cigarettes.  He has a 30 pack-year smoking history. He has never used smokeless tobacco. He reports that he drinks alcohol. He reports that he does not use illicit drugs.  Allergies:  Allergies  Allergen Reactions  . Penicillins Hives    Medications:  Scheduled: . aspirin  300 mg Rectal Daily  . sodium chloride flush  3 mL Intravenous Q12H   Continuous: . sodium chloride 100 mL/hr at 02/05/2016 2200   GUY:[id number] **OR** acetaminophen, HYDROmorphone (DILAUDID) injection, ondansetron **OR** ondansetron (ZOFRAN) IV  Results for orders placed or performed during the hospital encounter of 02/20/2016 (from the past 48 hour(s))  CBC     Status: Abnormal   Collection Time: 01/31/2016 10:07 PM   Result Value Ref Range   WBC 18.3 (H) 4.0 - 10.5 K/uL   RBC 4.05 (L) 4.22 - 5.81 MIL/uL   Hemoglobin 12.4 (L) 13.0 - 17.0 g/dL   HCT 75.6 (L) 43.3 - 29.5 %   MCV 89.6 78.0 - 100.0 fL   MCH 30.6 26.0 - 34.0 pg   MCHC 34.2 30.0 - 36.0 g/dL   RDW 18.8 41.6 - 60.6 %   Platelets 241 150 - 400 K/uL    No results found.  Review of Systems  Constitutional: Negative for fever.  Eyes: Negative.   Respiratory: Negative for cough and shortness of breath.   Cardiovascular: Negative for chest pain and palpitations.  Gastrointestinal: Positive for nausea and abdominal pain. Negative for vomiting and diarrhea.  Genitourinary: Negative.   Musculoskeletal: Negative.   Skin: Negative.   Neurological: Negative for headaches.       Recent CVA  Endo/Heme/Allergies: Negative.   Psychiatric/Behavioral: Negative.    Blood pressure 114/73, pulse 86, temperature 98.2 F (36.8 C), temperature source Oral, resp. rate 24, height  (1.753 m), weight 74.5 kg (164 lb 3.9 oz), SpO2 91 %. Physical Exam  Constitutional: He appears well-developed. No distress.  HENT:  Head: Normocephalic and atraumatic.  Right Ear: External ear normal.  Left Ear: External ear normal.  Mouth/Throat: Oropharynx is clear and moist.  Neck: Neck supple. No tracheal deviation present.  Cardiovascular: Normal rate.   occasional ectopy  Respiratory: Effort normal. No stridor. No respiratory distress. He has no wheezes.  GI: Soft. He exhibits distension. There  is tenderness. There is guarding. There is no rebound.  Moderate distention with significant tenderness and voluntary guarding, no clear peritonitis  Musculoskeletal: He exhibits no tenderness.  Neurological: He is alert.  Speech fluent  Skin: Skin is warm.    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 Dr. Arbie CookeyEarly plans exploration with possible vascular bypass,  possible aortobifemoral bypass. I will join him in the operating room for evaluation of the intestines. I am concerned there is significant intestinal ischemia and he may need bowel resection. He may also require a staged procedure with temporary open abdomen.I discussed my portion of the procedure with the family and they have already signed consent with Dr. Arbie CookeyEarly. I answered their questions.  Cherrell Maybee E 02/12/2016, 11:09 PM

## 2016-01-30 NOTE — Progress Notes (Addendum)
Pt transported to holding VSS, family in waiting room

## 2016-01-31 ENCOUNTER — Encounter (HOSPITAL_COMMUNITY): Payer: Self-pay | Admitting: Vascular Surgery

## 2016-01-31 ENCOUNTER — Inpatient Hospital Stay (HOSPITAL_COMMUNITY): Payer: BLUE CROSS/BLUE SHIELD

## 2016-01-31 DIAGNOSIS — R109 Unspecified abdominal pain: Secondary | ICD-10-CM | POA: Diagnosis present

## 2016-01-31 DIAGNOSIS — I63443 Cerebral infarction due to embolism of bilateral cerebellar arteries: Secondary | ICD-10-CM

## 2016-01-31 DIAGNOSIS — I749 Embolism and thrombosis of unspecified artery: Secondary | ICD-10-CM

## 2016-01-31 DIAGNOSIS — K55019 Acute (reversible) ischemia of small intestine, extent unspecified: Secondary | ICD-10-CM

## 2016-01-31 LAB — CBC
HCT: 34.2 % — ABNORMAL LOW (ref 39.0–52.0)
HCT: 32.7 % — ABNORMAL LOW (ref 39.0–52.0)
HCT: 32.6 % — ABNORMAL LOW (ref 39.0–52.0)
HCT: 32.7 % — ABNORMAL LOW (ref 39.0–52.0)
HEMOGLOBIN: 11 g/dL — AB (ref 13.0–17.0)
Hemoglobin: 11.6 g/dL — ABNORMAL LOW (ref 13.0–17.0)
Hemoglobin: 11.1 g/dL — ABNORMAL LOW (ref 13.0–17.0)
Hemoglobin: 10.9 g/dL — ABNORMAL LOW (ref 13.0–17.0)
MCH: 30.3 pg (ref 26.0–34.0)
MCH: 30.3 pg (ref 26.0–34.0)
MCH: 30.5 pg (ref 26.0–34.0)
MCH: 30.2 pg (ref 26.0–34.0)
MCHC: 33.9 g/dL (ref 30.0–36.0)
MCHC: 33.9 g/dL (ref 30.0–36.0)
MCHC: 33.4 g/dL (ref 30.0–36.0)
MCHC: 33.6 g/dL (ref 30.0–36.0)
MCV: 89.3 fL (ref 78.0–100.0)
MCV: 89.3 fL (ref 78.0–100.0)
MCV: 91.3 fL (ref 78.0–100.0)
MCV: 89.8 fL (ref 78.0–100.0)
PLATELETS: 224 10*3/uL (ref 150–400)
PLATELETS: 216 10*3/uL (ref 150–400)
PLATELETS: 193 10*3/uL (ref 150–400)
PLATELETS: 196 10*3/uL (ref 150–400)
RBC: 3.83 MIL/uL — ABNORMAL LOW (ref 4.22–5.81)
RBC: 3.66 MIL/uL — AB (ref 4.22–5.81)
RBC: 3.57 MIL/uL — AB (ref 4.22–5.81)
RBC: 3.64 MIL/uL — AB (ref 4.22–5.81)
RDW: 12.6 % (ref 11.5–15.5)
RDW: 12.7 % (ref 11.5–15.5)
RDW: 12.8 % (ref 11.5–15.5)
RDW: 12.8 % (ref 11.5–15.5)
WBC: 17 10*3/uL — AB (ref 4.0–10.5)
WBC: 16.4 10*3/uL — ABNORMAL HIGH (ref 4.0–10.5)
WBC: 13.5 10*3/uL — ABNORMAL HIGH (ref 4.0–10.5)
WBC: 14.8 10*3/uL — ABNORMAL HIGH (ref 4.0–10.5)

## 2016-01-31 LAB — BLOOD GAS, ARTERIAL
ACID-BASE DEFICIT: 3.8 mmol/L — AB (ref 0.0–2.0)
Bicarbonate: 20.7 mEq/L (ref 20.0–24.0)
DRAWN BY: 437071
FIO2: 0.8
MECHVT: 570 mL
O2 SAT: 99.1 %
PATIENT TEMPERATURE: 98.6
PCO2 ART: 37.5 mmHg (ref 35.0–45.0)
PEEP: 5 cmH2O
PH ART: 7.361 (ref 7.350–7.450)
RATE: 16 resp/min
TCO2: 21.9 mmol/L (ref 0–100)
pO2, Arterial: 219 mmHg — ABNORMAL HIGH (ref 80.0–100.0)

## 2016-01-31 LAB — BASIC METABOLIC PANEL
ANION GAP: 9 (ref 5–15)
BUN: 18 mg/dL (ref 6–20)
CALCIUM: 8.2 mg/dL — AB (ref 8.9–10.3)
CO2: 21 mmol/L — ABNORMAL LOW (ref 22–32)
Chloride: 104 mmol/L (ref 101–111)
Creatinine, Ser: 0.97 mg/dL (ref 0.61–1.24)
GFR calc Af Amer: >60 mL/min (ref >60)
GLUCOSE: 128 mg/dL — AB (ref 65–99)
Potassium: 3.3 mmol/L — ABNORMAL LOW (ref 3.5–5.1)
Sodium: 134 mmol/L — ABNORMAL LOW (ref 135–145)

## 2016-01-31 LAB — COMPREHENSIVE METABOLIC PANEL
ALBUMIN: 2.5 g/dL — AB (ref 3.5–5.0)
ALK PHOS: 59 U/L (ref 38–126)
ALT: 32 U/L (ref 17–63)
AST: 25 U/L (ref 15–41)
Anion gap: 9 (ref 5–15)
BUN: 17 mg/dL (ref 6–20)
CHLORIDE: 103 mmol/L (ref 101–111)
CO2: 22 mmol/L (ref 22–32)
CREATININE: 0.82 mg/dL (ref 0.61–1.24)
Calcium: 8.1 mg/dL — ABNORMAL LOW (ref 8.9–10.3)
GFR calc non Af Amer: >60 mL/min (ref >60)
GLUCOSE: 138 mg/dL — AB (ref 65–99)
Potassium: 4.2 mmol/L (ref 3.5–5.1)
SODIUM: 134 mmol/L — AB (ref 135–145)
Total Bilirubin: 0.5 mg/dL (ref 0.3–1.2)
Total Protein: 6 g/dL — ABNORMAL LOW (ref 6.5–8.1)

## 2016-01-31 LAB — MAGNESIUM: MAGNESIUM: 2 mg/dL (ref 1.7–2.4)

## 2016-01-31 LAB — PROTIME-INR
INR: 1.18 (ref 0.00–1.49)
INR: 1.25 (ref 0.00–1.49)
Prothrombin Time: 15.2 seconds (ref 11.6–15.2)
Prothrombin Time: 15.8 seconds — ABNORMAL HIGH (ref 11.6–15.2)

## 2016-01-31 LAB — ANTITHROMBIN III: ANTITHROMB III FUNC: 85 % (ref 75–120)

## 2016-01-31 LAB — PHOSPHORUS: Phosphorus: 3 mg/dL (ref 2.5–4.6)

## 2016-01-31 LAB — AMYLASE: AMYLASE: 116 U/L — AB (ref 28–100)

## 2016-01-31 LAB — APTT: aPTT: 32 seconds (ref 24–37)

## 2016-01-31 LAB — TSH: TSH: 1.388 u[IU]/mL (ref 0.350–4.500)

## 2016-01-31 LAB — LACTIC ACID, PLASMA: Lactic Acid, Venous: 0.9 mmol/L (ref 0.5–2.0)

## 2016-01-31 LAB — ABO/RH: ABO/RH(D): A POS

## 2016-01-31 LAB — MRSA PCR SCREENING: MRSA by PCR: NEGATIVE

## 2016-01-31 LAB — HEPARIN LEVEL (UNFRACTIONATED): Heparin Unfractionated: <0.1 IU/mL — ABNORMAL LOW (ref 0.30–0.70)

## 2016-01-31 MED ORDER — PROPOFOL 10 MG/ML IV BOLUS
INTRAVENOUS | Status: DC | PRN
  Administered 2016-01-30: 65 mg via INTRAVENOUS

## 2016-01-31 MED ORDER — SODIUM CHLORIDE 0.9 % IV SOLN
INTRAVENOUS | Status: DC | PRN
  Administered 2016-01-31: 500 mL

## 2016-01-31 MED ORDER — POTASSIUM CHLORIDE CRYS ER 20 MEQ PO TBCR: 20.0000 meq | EXTENDED_RELEASE_TABLET | Freq: Every day | ORAL | Status: DC | PRN

## 2016-01-31 MED ORDER — GUAIFENESIN-DM 100-10 MG/5ML PO SYRP: 15.0000 mL | ORAL_SOLUTION | ORAL | Status: DC | PRN

## 2016-01-31 MED ORDER — FAMOTIDINE IN NACL 20-0.9 MG/50ML-% IV SOLN
20.0000 mg | Freq: Two times a day (BID) | INTRAVENOUS | Status: AC
  Administered 2016-01-31 – 2016-02-05 (×11): 20 mg via INTRAVENOUS
  Filled 2016-01-31 (×11): qty 50

## 2016-01-31 MED ORDER — CEFAZOLIN SODIUM 1 G IJ SOLR
INTRAMUSCULAR | Status: AC
  Filled 2016-01-31: qty 20

## 2016-01-31 MED ORDER — DOCUSATE SODIUM 100 MG PO CAPS
100.0000 mg | ORAL_CAPSULE | Freq: Every day | ORAL | Status: DC
  Filled 2016-01-31: qty 1

## 2016-01-31 MED ORDER — PHENOL 1.4 % MT LIQD: 1.0000 | OROMUCOSAL | Status: DC | PRN

## 2016-01-31 MED ORDER — CEFAZOLIN SODIUM-DEXTROSE 2-3 GM-% IV SOLR
INTRAVENOUS | Status: DC | PRN
Start: 2016-01-31 — End: 2016-01-31
  Administered 2016-01-31: 2 g via INTRAVENOUS

## 2016-01-31 MED ORDER — HYDRALAZINE HCL 20 MG/ML IJ SOLN: 5.0000 mg | INTRAMUSCULAR | Status: DC | PRN

## 2016-01-31 MED ORDER — ALUM & MAG HYDROXIDE-SIMETH 200-200-20 MG/5ML PO SUSP: 15.0000 mL | ORAL | Status: DC | PRN

## 2016-01-31 MED ORDER — LACTATED RINGERS IV SOLN
INTRAVENOUS | Status: DC | PRN
  Administered 2016-01-30: via INTRAVENOUS

## 2016-01-31 MED ORDER — ROCURONIUM 10MG/ML (10ML) SYRINGE FOR MEDFUSION PUMP - OPTIME
INTRAVENOUS | Status: DC | PRN
  Administered 2016-01-31: 20 mg via INTRAVENOUS

## 2016-01-31 MED ORDER — FENTANYL BOLUS VIA INFUSION
50.0000 ug | INTRAVENOUS | Status: DC | PRN
  Administered 2016-01-31: 50 ug via INTRAVENOUS
  Filled 2016-01-31: qty 50

## 2016-01-31 MED ORDER — FENTANYL CITRATE (PF) 250 MCG/5ML IJ SOLN
INTRAMUSCULAR | Status: DC | PRN
  Administered 2016-01-30: 150 ug via INTRAVENOUS
  Administered 2016-01-31: 100 ug via INTRAVENOUS
  Administered 2016-01-31 (×2): 150 ug via INTRAVENOUS
  Administered 2016-01-31 (×2): 100 ug via INTRAVENOUS

## 2016-01-31 MED ORDER — ANTISEPTIC ORAL RINSE SOLUTION (CORINZ)
7.0000 mL | Freq: Four times a day (QID) | OROMUCOSAL | Status: DC
  Administered 2016-01-31 – 2016-02-06 (×24): 7 mL via OROMUCOSAL

## 2016-01-31 MED ORDER — METOPROLOL TARTRATE 5 MG/5ML IV SOLN
2.0000 mg | INTRAVENOUS | Status: DC | PRN
  Filled 2016-01-31: qty 5

## 2016-01-31 MED ORDER — PHENYLEPHRINE HCL 10 MG/ML IJ SOLN
30.0000 ug/min | INTRAMUSCULAR | Status: DC
  Administered 2016-02-01 (×2): 50 ug/min via INTRAVENOUS
  Administered 2016-02-02: 40 ug/min via INTRAVENOUS
  Administered 2016-02-02: 20 ug/min via INTRAVENOUS
  Administered 2016-02-02: 50 ug/min via INTRAVENOUS
  Administered 2016-02-03: 30 ug/min via INTRAVENOUS
  Administered 2016-02-04: 20 ug/min via INTRAVENOUS
  Filled 2016-01-31 (×10): qty 1

## 2016-01-31 MED ORDER — SODIUM CHLORIDE 0.9 % IV SOLN
25.0000 ug/h | INTRAVENOUS | Status: DC
  Administered 2016-01-31: 150 ug/h via INTRAVENOUS
  Administered 2016-01-31: 275 ug/h via INTRAVENOUS
  Administered 2016-01-31: 350 ug/h via INTRAVENOUS
  Administered 2016-02-01 (×2): 400 ug/h via INTRAVENOUS
  Administered 2016-02-01: 350 ug/h via INTRAVENOUS
  Administered 2016-02-01 – 2016-02-02 (×3): 400 ug/h via INTRAVENOUS
  Administered 2016-02-02: 200 ug/h via INTRAVENOUS
  Administered 2016-02-02: 100 ug/h via INTRAVENOUS
  Administered 2016-02-03 – 2016-02-04 (×3): 200 ug/h via INTRAVENOUS
  Administered 2016-02-04: 250 ug/h via INTRAVENOUS
  Filled 2016-01-31 (×15): qty 50

## 2016-01-31 MED ORDER — POTASSIUM CHLORIDE 10 MEQ/50ML IV SOLN
10.0000 meq | INTRAVENOUS | Status: AC
Start: 2016-01-31 — End: 2016-01-31
  Administered 2016-01-31 (×4): 10 meq via INTRAVENOUS
  Filled 2016-01-31 (×4): qty 50

## 2016-01-31 MED ORDER — SUCCINYLCHOLINE CHLORIDE 20 MG/ML IJ SOLN
INTRAMUSCULAR | Status: DC | PRN
  Administered 2016-01-30: 120 mg via INTRAVENOUS

## 2016-01-31 MED ORDER — MIDAZOLAM HCL 2 MG/2ML IJ SOLN
INTRAMUSCULAR | Status: DC | PRN
  Administered 2016-01-30: 2 mg via INTRAVENOUS

## 2016-01-31 MED ORDER — ALBUTEROL SULFATE (2.5 MG/3ML) 0.083% IN NEBU: 2.5000 mg | INHALATION_SOLUTION | RESPIRATORY_TRACT | Status: DC | PRN

## 2016-01-31 MED ORDER — PANTOPRAZOLE SODIUM 40 MG PO TBEC
40.0000 mg | DELAYED_RELEASE_TABLET | Freq: Every day | ORAL | Status: DC
  Filled 2016-01-31 (×2): qty 1

## 2016-01-31 MED ORDER — 0.9 % SODIUM CHLORIDE (POUR BTL) OPTIME
TOPICAL | Status: DC | PRN
  Administered 2016-01-31: 3000 mL

## 2016-01-31 MED ORDER — SODIUM CHLORIDE 0.9 % IV SOLN
1.0000 g | INTRAVENOUS | Status: DC
  Administered 2016-01-31 – 2016-02-08 (×9): 1 g via INTRAVENOUS
  Filled 2016-01-31 (×9): qty 1

## 2016-01-31 MED ORDER — LABETALOL HCL 5 MG/ML IV SOLN
10.0000 mg | INTRAVENOUS | Status: DC | PRN
  Administered 2016-02-03: 10 mg via INTRAVENOUS
  Filled 2016-01-31: qty 4

## 2016-01-31 MED ORDER — CHLORHEXIDINE GLUCONATE 0.12% ORAL RINSE (MEDLINE KIT)
15.0000 mL | Freq: Two times a day (BID) | OROMUCOSAL | Status: DC
  Administered 2016-01-31 – 2016-02-06 (×14): 15 mL via OROMUCOSAL

## 2016-01-31 MED ORDER — FENTANYL CITRATE (PF) 100 MCG/2ML IJ SOLN
50.0000 ug | Freq: Once | INTRAMUSCULAR | Status: AC
Start: 2016-01-31 — End: 2016-01-31
  Administered 2016-01-31: 50 ug via INTRAVENOUS
  Filled 2016-01-31: qty 2

## 2016-01-31 MED ORDER — MIDAZOLAM HCL 2 MG/2ML IJ SOLN
2.0000 mg | INTRAMUSCULAR | Status: DC | PRN
  Administered 2016-01-31 – 2016-02-06 (×23): 2 mg via INTRAVENOUS
  Filled 2016-01-31 (×26): qty 2

## 2016-01-31 MED ORDER — FENTANYL CITRATE (PF) 250 MCG/5ML IJ SOLN
INTRAMUSCULAR | Status: AC
  Filled 2016-01-31: qty 5

## 2016-01-31 MED ORDER — LIDOCAINE HCL (CARDIAC) 20 MG/ML IV SOLN
INTRAVENOUS | Status: DC | PRN
  Administered 2016-01-30: 40 mg via INTRATRACHEAL

## 2016-01-31 MED ORDER — SODIUM CHLORIDE 0.9 % IV SOLN
500.0000 mL | Freq: Once | INTRAVENOUS | Status: AC | PRN
  Administered 2016-02-02: 500 mL via INTRAVENOUS

## 2016-01-31 MED ORDER — MIDAZOLAM HCL 2 MG/2ML IJ SOLN
2.0000 mg | INTRAMUSCULAR | Status: AC | PRN
  Administered 2016-01-31 – 2016-02-01 (×3): 2 mg via INTRAVENOUS
  Filled 2016-01-31 (×2): qty 2

## 2016-01-31 MED ORDER — MAGNESIUM SULFATE 2 GM/50ML IV SOLN: 2.0000 g | Freq: Every day | INTRAVENOUS | Status: DC | PRN

## 2016-01-31 MED ORDER — ROCURONIUM BROMIDE 100 MG/10ML IV SOLN
INTRAVENOUS | Status: DC | PRN
  Administered 2016-01-31: 50 mg via INTRAVENOUS
  Administered 2016-01-31: 20 mg via INTRAVENOUS

## 2016-01-31 MED ORDER — KCL IN DEXTROSE-NACL 20-5-0.45 MEQ/L-%-% IV SOLN
INTRAVENOUS | Status: DC
  Administered 2016-01-31: 14:00:00 via INTRAVENOUS
  Administered 2016-01-31 – 2016-02-01 (×3): 125 mL/h via INTRAVENOUS
  Administered 2016-02-01 – 2016-02-02 (×3): via INTRAVENOUS
  Filled 2016-01-31 (×12): qty 1000

## 2016-01-31 MED ORDER — DEXTROSE 5 % IV SOLN
2.0000 g | INTRAVENOUS | Status: AC
  Administered 2016-01-31: 2 g via INTRAVENOUS
  Filled 2016-01-31: qty 2

## 2016-01-31 MED FILL — Heparin Sodium (Porcine) 100 Unt/ML in Sodium Chloride 0.45%: INTRAMUSCULAR | Qty: 250 | Status: AC

## 2016-01-31 MED FILL — Heparin Sodium (Porcine) Inj 1000 Unit/ML: INTRAMUSCULAR | Qty: 30 | Status: AC

## 2016-01-31 MED FILL — Sodium Chloride IV Soln 0.9%: INTRAVENOUS | Qty: 3000 | Status: AC

## 2016-01-31 NOTE — Progress Notes (Signed)
PULMONARY / CRITICAL CARE MEDICINE   Name: Brian Baxter MRN: [id number] DOB: 05-Nov-1955    ADMISSION DATE:  02/06/2016 CONSULTATION DATE:  01/31/2016  REFERRING MD:  Dr. Arbie Cookey  CHIEF COMPLAINT:  Abdominal pain  HISTORY OF PRESENT ILLNESS:   60 year old male with a past medical history significant for hypertension and tobacco abuse was admitted to [facility] on 02/18/2016 in the setting of small bowel ischemia and possible right kidney ischemia. He has been admitted to an outside hospital on 01/25/2016 in the setting of visual changes. He was noted on MRI brain to have multiple strokes, one in the right occipital lobe which was the largest. During that hospitalization at the outside hospital he underwent an extensive workup to look for an etiology of embolic clot which was felt to be the cause of his strokes. He had a TTE with contrast which initially was suggestive of a patent foramen ovale but a follow-up TEE on the following day with contrast showed no evidence of right to left shunt. Both studies showed no evidence of vegetation on the valve. He had an MRI of his abdomen which showed that the right iliac vein was slightly compressed by the overlying iliac artery there was no evidence of venous clots. He also had a lower extremity Doppler ultrasound which was negative. He was discharged on a statin and aspirin instructions to quit smoking.  On 01/29/2016 he developed severe abdominal pain and went to another outside hospital and then was transferred to [place] for further evaluation. He was initially accepted by [place] for consideration of TPA infusion by our interventional radiology group but considering his recent strokes vascular and general surgery were consulted for further evaluation.  He was taken emergently to the operating room on 02/24/2016 where he was found to have ischemic bowel near the ileum. Bowel was resected and no re-anastomosis attempts were made, he was left open with a  wound VAC in place. He was transferred to the intensive care unit intubated on a low-dose of Neo-Synephrine.    SUBJECTIVE:  Awake on vent  VITAL SIGNS: BP 111/70 mmHg  Pulse 78  Temp(Src) 98.1 F (36.7 C) (Axillary)  Resp 15  Ht 5\' 9"  (1.753 m)  Wt 171 lb 4.8 oz (77.7 kg)  BMI 25.28 kg/m2  SpO2 97%  HEMODYNAMICS:    VENTILATOR SETTINGS: Vent Mode:  [-] PRVC FiO2 (%):  [50 %-80 %] 50 % Set Rate:  [14 bmp-16 bmp] 14 bmp Vt Set:  [570 mL] 570 mL PEEP:  [5 cmH20] 5 cmH20 Plateau Pressure:  [18 cmH20] 18 cmH20  INTAKE / OUTPUT: I/O last 3 completed shifts: In: 2379.8 [I.V.:2129.8; IV Piggyback:250] Out: 555 [Urine:405; Drains:100; Blood:50]  PHYSICAL EXAMINATION: General:  Sedated on vent, opens eyes to voice, follows commands Neuro:  Opens eyes to voice, follows commands, nods head, moving all 4 extremities HEENT:  Normocephalic/atraumatic, endotracheal tube in place Cardiovascular:  Regular rate and rhythm, no murmurs gallops rubs Lungs:  Clear to auscultation bilaterally with vent supported breaths Abdomen:  No bowel sounds, large wound VAC in place Musculoskeletal:  Normal bulk and tone Skin:  Large wound VAC in place abdomen  LABS:  BMET  Recent Labs Lab 02/17/2016 2207 01/31/16 0230 01/31/16 0505  NA 133* 134* 134*  K 3.6 3.3* 4.2  CL 102 104 103  CO2 21* 21* 22  BUN 18 18 17   CREATININE 0.86 0.97 0.82  GLUCOSE 108* 128* 138*    Electrolytes  Recent Labs Lab 02/16/2016  2207 01/31/16 0230 01/31/16 0505  CALCIUM 8.7* 8.2* 8.1*  MG  --  2.0  --   PHOS  --   --  3.0    CBC  Recent Labs Lab 02/02/2016 2207 01/31/16 0230 01/31/16 0505  WBC 18.3* 17.0* 16.4*  HGB 12.4* 11.6* 11.1*  HCT 36.3* 34.2* 32.7*  PLT 241 224 216    Coag's  Recent Labs Lab 01/31/16 0230 01/31/16 0505  APTT 32  --   INR 1.18 1.25    Sepsis Markers  Recent Labs Lab 02/05/2016 2207 01/31/16 0230  LATICACIDVEN 0.8 0.9    ABG  Recent Labs Lab  01/31/16 0230  PHART 7.361  PCO2ART 37.5  PO2ART 219*    Liver Enzymes  Recent Labs Lab 02/13/2016 2207 01/31/16 0505  AST 28 25  ALT 34 32  ALKPHOS 70 59  BILITOT 0.9 0.5  ALBUMIN 2.9* 2.5*    Cardiac Enzymes No results for input(s): TROPONINI, PROBNP in the last 168 hours.  Glucose No results for input(s): GLUCAP in the last 168 hours.  Imaging Portable Chest Xray  01/31/2016  CLINICAL DATA:  60 year old male with acute respiratory failure and hypoxia. Status post exploratory laparotomy. EXAM: PORTABLE CHEST 1 VIEW COMPARISON:  None FINDINGS: Endotracheal tube with tip approximately 2.3 cm above the carina. Recommend retraction by approximately 3- 4 cm for optimal positioning. An enteric tube is partially visualized coursing to the left in the abdomen. There is a right IJ central venous line with tip over central SVC. There is atelectatic changes at the left lung base. No focal consolidation. No significant pleural effusion. No pneumothorax. Top-normal cardiac size. No acute osseous pathology. IMPRESSION: Endotracheal tube above the carina. Left lung base atelectatic changes. No focal consolidation. No pneumothorax. Electronically Signed   By: Elgie Collard M.D.   On: 01/31/2016 03:29     STUDIES:  01/26/2016 outside hospital TTE: Left atrial enlargement, contrast suggestive of patent foramen ovale 01/26/2016 bilateral carotid ultrasound: No significant stenosis 01/26/2016 ultrasound Doppler legs>  negative for  DVT 01/27/2016 MR angiogram pelvis: Mild compression of the left common iliac vein without significant narrowing or thrombosis 01/27/2016 outside hospital TTE> Normal valves, left atrial enlargement, there is no abnormal contrast to suggest intra-atrial shunting 6/5 CT abdomen OSH> CT angiogram findings worrisome for superior mesenteric artery clot, small bowel ischemia, infarcts to kidney and spleen.  CULTURES: None  ANTIBIOTICS: 01/31/2016  ertapenem  SIGNIFICANT EVENTS: June Fifth 2017 CT angiogram findings worrisome for superior mesenteric artery clot, taken to the operating room where several inches of ileum were resected  LINES/TUBES: 02/19/2016 endotracheal tube  DISCUSSION: This is a 60 year old male who has small bowel ischemia, possible renal infarct in the setting of the superior mesenteric artery clot. This occurs several days after recent hospitalization for stroke and what was felt to be an embolic cause of his stroke. The etiology of his underlying hypercoagulable state is uncertain, is still uncertain if there is one common clot leading to embolic phenomena but the extensive workup performed at the outside hospital was suggest otherwise. At this time he remains critically ill after having emergent bowel resection and he is sedated on the ventilator with plans to go back to the operating room in 48 hours for second look and possible vascular intervention.  ASSESSMENT / PLAN:  PULMONARY A: Mechanical ventilatory support postoperative History of tobacco use P:   Full ventilator support For return to OR in 48 hours 6/8. Keep sedated on vent for now with open  abd. 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 Shock postoperatively, likely sedation related P:  Vascular surgery involved in open abd 6/6 Telemetry monitoring Hold anticoagulation given recent large ischemic stroke Titrate Neo-Synephrine to maintain mean arterial pressure greater than 65, consider change to levophed due to bradycardia Start hypercoagulable workup Continue aspirin if okay by vascular surgery and general surgery Neurology consult  Consider repeat TEE or TTE  RENAL A:   Concern for partial right renal infarct on CT angiogram, normal renal function P:   Monitor BMET and UOP Replace electrolytes as needed   GASTROINTESTINAL A:   Small bowel ischemia secondary to  superior mesenteric artery clot Post laparotomy with open abd P:   Postoperative care for general surgery Nothing by mouth PPI  HEMATOLOGIC A:   Concern for hypercoagulable state Stents of the workup for embolic cause of multiple arterial strokes negative at outside hospital 3-4 days prior to admission P:  Send hypercoagulability panel Consider hematology consult depending on hypercoag results Hold heparin until okay by neurology, presumably June 13 (14 days post stroke)  INFECTIOUS A:   Peritonitis P:   Start Ertapenem  ENDOCRINE A:   No acute  issues P:   Monitor glucose  NEUROLOGIC A:   Recent stroke P:   RASS goal: -3 Heavy sedation while abdomen open Fentanyl gtt Versed prn   FAMILY  - Updates: none bedside on my exam  - Inter-disciplinary family meet or Palliative Care meeting due by:  day 7  Steve Minor ACNP [facility] Pager [phone] till 3 pm If no answer page [phone] 01/31/2016, 7:41 AM  Attending Note:  I have examined patient, reviewed labs, studies and notes. I have discussed the case with S Minor, and I agree with the data and plans as amended above. 60 yo man, intubated and deeply sedated s/p ileum resection from SMA thrombosis. Also w hx CVA's as detailed above. On my eval he is hemodynamically improved, is requiring Fio2 0.40 + PEEP 5. Wakes to voice, then quickly back to sleep. We will plan to continue MV pending trip back to OR for abd closure. Hypercoag panel has been sent and is pending. Will consult hematology. Independent critical care time is 30 minutes.   Levy Pupaobert Gehrig Patras, MD, PhD 01/31/2016, 1:28 PM [facility] [phone] or if no answer [phone]

## 2016-01-31 NOTE — Care Management Note (Signed)
Case Management Note  Patient Details  Name: Regis BillMichael E Kussman MRN: [id number] Date of Birth: July 11, 1956  Subjective/Objective:    s/p Procedure(s): SMALL BOWEL RESECTION (N/A) EXPLORATORY LAPAROTOMY (N/A                Action/Plan: PTA - independent from home with wife.  Current plan is for pt to remain on vent until abd wound closed.  CM will continue to follow for disposition needs   Expected Discharge Date:                  Expected Discharge Plan:  Home w Home Health Services  In-House Referral:     Discharge planning Services  CM Consult  Post Acute Care Choice:    Choice offered to:     DME Arranged:    DME Agency:     HH Arranged:    HH Agency:     Status of Service:  In process, will continue to follow  Medicare Important Message Given:    Date Medicare IM Given:    Medicare IM give by:    Date Additional Medicare IM Given:    Additional Medicare Important Message give by:     If discussed at Long Length of Stay Meetings, dates discussed:    Additional Comments:  Cherylann ParrClaxton, Langston Tuberville S, RN 01/31/2016, 4:04 PM

## 2016-01-31 NOTE — Anesthesia Preprocedure Evaluation (Signed)
Anesthesia Evaluation  Patient identified by MRN, date of birth, ID band Patient awake    Reviewed: Allergy & Precautions, NPO status , Patient's Chart, lab work & pertinent test results  Airway Mallampati: II  TM Distance: >3 FB Neck ROM: Full    Dental  (+) Teeth Intact, Dental Advisory Given   Pulmonary Current Smoker,    breath sounds clear to auscultation       Cardiovascular hypertension,  Rhythm:Regular Rate:Normal     Neuro/Psych    GI/Hepatic   Endo/Other    Renal/GU      Musculoskeletal   Abdominal (+)  Abdomen: tender.    Peds  Hematology   Anesthesia Other Findings   Reproductive/Obstetrics                             Anesthesia Physical Anesthesia Plan  ASA: IV and emergent  Anesthesia Plan: General   Post-op Pain Management:    Induction: Intravenous  Airway Management Planned: Oral ETT  Additional Equipment: Arterial line and CVP  Intra-op Plan:   Post-operative Plan: Post-operative intubation/ventilation  Informed Consent: I have reviewed the patients History and Physical, chart, labs and discussed the procedure including the risks, benefits and alternatives for the proposed anesthesia with the patient or authorized representative who has indicated his/her understanding and acceptance.   Dental advisory given  Plan Discussed with: CRNA and Anesthesiologist  Anesthesia Plan Comments:         Anesthesia Quick Evaluation

## 2016-01-31 NOTE — Op Note (Signed)
    OPERATIVE REPORT  DATE OF SURGERY: 01/31/2016  PATIENT: Brian Baxter, 60 y.o. 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 Violeta GelinasBurke Thompson  PHYSICIAN ASSISTANT: Thomasena Edisollins PA-C  ANESTHESIA:  Gen.  EBL: Minimal ml  Total I/O In: 1400 [I.V.:1400] Out: 200 [Urine:150; Blood:50]  BLOOD ADMINISTERED: None  DRAINS: None  SPECIMEN: Distal ileum  COUNTS CORRECT:  YES  PLAN OF CARE: Surgical intensive care unit   PATIENT DISPOSITION:  PACU - hemodynamically stable  PROCEDURE DETAILS: Patient was taken the operating room where the abdomen and both groins prepped in usual fashion. Incision was made from the level wasn't midline past the umbilicus towards the pubic bone. The abdominal fat was opened with electrocautery. The linea alba was opened with electrocautery as well. The peritoneum was entered and there was some cloudy fluid in the pelvis and segment of distal ileum was frankly necrotic. This was a foul-smelling. There did not appear to be any gross perforation. The remainder of the abdominal examination revealed no evidence of ischemia to the liver, gallbladder, remaining small bowel or large bowel. The aorta at the level of the diaphragm had minimal calcifications education. The infrarenal aorta was extensively calcified as were the iliac vessels.  Decision was made to resect the clearly nonviable bowel and not leave GI continuity with the plan for relook in 48 hours. The bowel resection and VAC application will be dictated as a separate note by Dr. Paula Comptonhompson   Nitzia Perren, M.D. 01/31/2016 2:13 AM

## 2016-01-31 NOTE — Progress Notes (Signed)
Subjective: Interval History: none.. Sedated on the vent. Moves all 4 extremities. Difficult to assess neurologic function. Abdominal VAC functioning well  Objective: Vital signs in last 24 hours: Temp:  [98.1 F (36.7 C)-99 F (37.2 C)] 99 F (37.2 C) (06/06 0700) Pulse Rate:  [54-118] 75 (06/06 1000) Resp:  [12-44] 14 (06/06 1000) BP: (83-165)/(52-109) 121/76 mmHg (06/06 1000) SpO2:  [91 %-100 %] 98 % (06/06 1000) Arterial Line BP: (84-158)/(39-87) 121/55 mmHg (06/06 1000) FiO2 (%):  [50 %-80 %] 50 % (06/06 1000) Weight:  [164 lb 3.9 oz (74.5 kg)-171 lb 4.8 oz (77.7 kg)] 171 lb 4.8 oz (77.7 kg) (06/06 0545)  Intake/Output from previous day: 06/05 0701 - 06/06 0700 In: 2399.8 [I.V.:2129.8; IV Piggyback:250] Out: 555 [Urine:405; Drains:100; Blood:50] Intake/Output this shift: Total I/O In: 555.7 [I.V.:445.7; Other:60; IV Piggyback:50] Out: 150 [Urine:150]  Abdominal VAC place. Seems to be tender over his abdomen. 2+ dorsalis pedis pulses bilaterally  Lab Results:  Recent Labs  01/31/16 0230 01/31/16 0505  WBC 17.0* 16.4*  HGB 11.6* 11.1*  HCT 34.2* 32.7*  PLT 224 216   BMET  Recent Labs  01/31/16 0230 01/31/16 0505  NA 134* 134*  K 3.3* 4.2  CL 104 103  CO2 21* 22  GLUCOSE 128* 138*  BUN 18 17  CREATININE 0.97 0.82  CALCIUM 8.2* 8.1*    Studies/Results: Portable Chest Xray  01/31/2016  CLINICAL DATA:  60 year old male with acute respiratory failure and hypoxia. Status post exploratory laparotomy. EXAM: PORTABLE CHEST 1 VIEW COMPARISON:  None FINDINGS: Endotracheal tube with tip approximately 2.3 cm above the carina. Recommend retraction by approximately 3- 4 cm for optimal positioning. An enteric tube is partially visualized coursing to the left in the abdomen. There is a right IJ central venous line with tip over central SVC. There is atelectatic changes at the left lung base. No focal consolidation. No significant pleural effusion. No pneumothorax. Top-normal  cardiac size. No acute osseous pathology. IMPRESSION: Endotracheal tube above the carina. Left lung base atelectatic changes. No focal consolidation. No pneumothorax. Electronically Signed   By: Elgie CollardArash  Radparvar M.D.   On: 01/31/2016 03:29   Anti-infectives: Anti-infectives    Start     Dose/Rate Route Frequency Ordered Stop   01/31/16 0400  ertapenem (INVANZ) 1 g in sodium chloride 0.9 % 50 mL IVPB     1 g 100 mL/hr over 30 Minutes Intravenous Every 24 hours 01/31/16 0334     01/31/16 0100  [MAR Hold]  cefOXitin (MEFOXIN) 2 g in dextrose 5 % 50 mL IVPB     (MAR Hold since 01/31/16 0049)   2 g 100 mL/hr over 30 Minutes Intravenous To Surgery 01/31/16 0046 01/31/16 0118      Assessment/Plan: s/p Procedure(s): SMALL BOWEL RESECTION (N/A) EXPLORATORY LAPAROTOMY (N/A) Remains hemodynamically stable. Appreciate critical care medicine's input. Will plan return to operating room tomorrow with the hope ability to do saphenous vein bypass to his spare mesenteric artery. This discussed with his son by telephone who is the primary contact person. Also discussed with general surgery plan for return to operating room tomorrow around noon.    LOS: 1 day   Gretta Beganarly, Lovelle Lema 01/31/2016, 10:56 AM

## 2016-01-31 NOTE — Consult Note (Signed)
PULMONARY / CRITICAL CARE MEDICINE   Name: Brian Baxter MRN: [id number] DOB: 1956-04-01    ADMISSION DATE:  02/17/2016 CONSULTATION DATE:  01/31/2016  REFERRING MD:  Dr. Arbie CookeyEarly  CHIEF COMPLAINT:  Abdominal pain  HISTORY OF PRESENT ILLNESS:   60 year old male with a past medical history significant for hypertension and tobacco abuse was admitted to [facility] on 01/29/2016 in the setting of small bowel ischemia and possible right kidney ischemia. He has been admitted to an outside hospital on 01/25/2016 in the setting of visual changes. He was noted on MRI brain to have multiple strokes, one in the right occipital lobe which was the largest. During that hospitalization at the outside hospital he underwent an extensive workup to look for an etiology of embolic clot which was felt to be the cause of his strokes. He had a TTE with contrast which initially was suggestive of a patent foramen ovale but a follow-up TEE on the following day with contrast showed no evidence of right to left shunt. Both studies showed no evidence of vegetation on the valve. He had an MRI of his abdomen which showed that the right iliac vein was slightly compressed by the overlying iliac artery there was no evidence of venous clots. He also had a lower extremity Doppler ultrasound which was negative. He was discharged on a statin and aspirin instructions to quit smoking.  On 01/29/2016 he developed severe abdominal pain and went to another outside hospital and then was transferred to us for further evaluation. He was initially accepted by us for consideration of TPA infusion by our interventional radiology group but considering his recent strokes vascular and general surgery were consulted for further evaluation.  He was taken emergently to the operating room on 02/02/2016 where he was found to have ischemic bowel near the ileum. Bowel was resected and no re-anastomosis attempts were made, he was left open with a  wound VAC in place. He was transferred to the intensive care unit intubated on a low-dose of Neo-Synephrine.  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  PAST SURGICAL HISTORY: He  has past surgical history that includes Cyst excision (Left, 1987).  Allergies  Allergen Reactions  . Penicillins Hives    No current facility-administered medications on file prior to encounter.   No current outpatient prescriptions on file prior to encounter.    FAMILY HISTORY:  His indicated that his father is deceased.   SOCIAL HISTORY: He  reports that he has been smoking Cigarettes.  He has a 30 pack-year smoking history. He has never used smokeless tobacco. He reports that he drinks alcohol. He reports that he does not use illicit drugs.  REVIEW OF SYSTEMS:   Cannot obtain due to intubation  SUBJECTIVE:  As above  VITAL SIGNS: BP 102/63 mmHg  Pulse 85  Temp(Src) 98.2 F (36.8 C) (Oral)  Resp 18  Ht 5\' 9"  (1.753 m)  Wt 74.5 kg (164 lb 3.9 oz)  BMI 24.24 kg/m2  SpO2 100%  HEMODYNAMICS:    VENTILATOR SETTINGS: Vent Mode:  [-] PRVC FiO2 (%):  [80 %] 80 % Vt Set:  [570 mL] 570 mL PEEP:  [5 cmH20] 5 cmH20 Plateau Pressure:  [18 cmH20] 18 cmH20  INTAKE / OUTPUT:    PHYSICAL EXAMINATION: General:  Sedated on vent, opens eyes to voice Neuro:  Opens eyes to voice, follows commands, nods head, moving all 4 extremities HEENT:  Normocephalic/atraumatic, endotracheal tube in place Cardiovascular:  Regular rate and rhythm, no murmurs gallops rubs Lungs:  Clear to auscultation bilaterally with vent supported breaths Abdomen:  No bowel sounds, large wound VAC in place Musculoskeletal:  Normal bulk and tone Skin:  Large wound VAC in place abdomen  LABS:  BMET  Recent Labs Lab 02/22/2016 2207  01/31/16 0230  NA 133* 134*  K 3.6 3.3*  CL 102 104  CO2 21* 21*  BUN 18 18  CREATININE 0.86 0.97  GLUCOSE 108* 128*    Electrolytes  Recent Labs Lab 02/20/2016 2207 01/31/16 0230  CALCIUM 8.7* 8.2*  MG  --  2.0    CBC  Recent Labs Lab 02/05/2016 2207  WBC 18.3*  HGB 12.4*  HCT 36.3*  PLT 241    Coag's No results for input(s): APTT, INR in the last 168 hours.  Sepsis Markers  Recent Labs Lab 02/01/2016 2207 01/31/16 0230  LATICACIDVEN 0.8 0.9    ABG  Recent Labs Lab 01/31/16 0230  PHART 7.361  PCO2ART 37.5  PO2ART 219*    Liver Enzymes  Recent Labs Lab 02/20/2016 2207  AST 28  ALT 34  ALKPHOS 70  BILITOT 0.9  ALBUMIN 2.9*    Cardiac Enzymes No results for input(s): TROPONINI, PROBNP in the last 168 hours.  Glucose No results for input(s): GLUCAP in the last 168 hours.  Imaging No results found.   STUDIES:  01/26/2016 outside hospital TTE: Left atrial enlargement, contrast suggestive of patent foramen ovale 01/26/2016 bilateral carotid ultrasound: No significant stenosis 01/26/2016 ultrasound Doppler legs>  negative for  DVT 01/27/2016 MR angiogram pelvis: Mild compression of the left common iliac vein without significant narrowing or thrombosis 01/27/2016 outside hospital TTE> Normal valves, left atrial enlargement, there is no abnormal contrast to suggest intra-atrial shunting 6/5 CT abdomen OSH> CT angiogram findings worrisome for superior mesenteric artery clot, small bowel ischemia, infarcts to kidney and spleen.  CULTURES: None  ANTIBIOTICS: 01/31/2016 ertapenem  SIGNIFICANT EVENTS: June Fifth 2017 CT angiogram findings worrisome for superior mesenteric artery clot, taken to the operating room where several inches of ileum were resected  LINES/TUBES: 02/13/2016 endotracheal tube  DISCUSSION: This is a 60 year old male who has small bowel ischemia, possible renal infarct in the setting of the superior mesenteric artery clot.  This occurs several days after recent hospitalization for stroke and what was felt to be an embolic cause of his stroke. The etiology of his underlying hypercoagulable state is uncertain, is still uncertain if there is one common clot leading to embolic phenomena but the extensive workup performed at the outside hospital was suggest otherwise. At this time he remains critically ill after having emergent bowel resection and he is sedated on the ventilator with plans to go back to the operating room in 48 hours for second look and possible vascular intervention.  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 Consider repeat TEE or TTE  RENAL A:  Concern for partial right renal infarct on CT angiogram, normal renal function P:   Monitor BMET and UOP Replace electrolytes as needed   GASTROINTESTINAL A:   Small bowel ischemia secondary to superior mesenteric artery clot P:   Postoperative care for general surgery Nothing by mouth PPI  HEMATOLOGIC A:   Concern for hypercoagulable state Stents of the workup for embolic cause of multiple arterial strokes negative at outside hospital 3-4 days prior to admission P:  Send hypercoagulability panel Consider hematology consult Hold heparin until okay by neurology, presumably June 13 (14 days post stroke)  INFECTIOUS A:   Peritonitis P:    Start Ertapenem  ENDOCRINE A:   No acute  issues P:   Monitor glucose  NEUROLOGIC A:   Recent stroke P:   RASS goal: -3 Heavy sedation while abdomen open Fentanyl gtt Versed prn   FAMILY  - Updates: none bedside on my exam  - Inter-disciplinary family meet or Palliative Care meeting due by:  day 7  My cc time 60 minutes  Heber [place], MD [place] [facility] Pager: [phone] Cell: [phone] After 3pm or if no response, call [phone]   01/31/2016, 3:07 AM

## 2016-01-31 NOTE — Anesthesia Procedure Notes (Signed)
Procedure Name: Intubation Date/Time: 01/27/2016 11:45 PM Performed by: Molli HazardGORDON, Myley Bahner M Pre-anesthesia Checklist: Patient identified, Emergency Drugs available, Suction available and Patient being monitored Patient Re-evaluated:Patient Re-evaluated prior to inductionOxygen Delivery Method: Circle system utilized Preoxygenation: Pre-oxygenation with 100% oxygen Intubation Type: IV induction and Rapid sequence Grade View: Grade I Tube type: Subglottic suction tube Tube size: 8.0 mm Number of attempts: 1 Airway Equipment and Method: Stylet Placement Confirmation: ETT inserted through vocal cords under direct vision,  positive ETCO2 and breath sounds checked- equal and bilateral Secured at: 21 cm Tube secured with: Tape Dental Injury: Teeth and Oropharynx as per pre-operative assessment

## 2016-01-31 NOTE — Op Note (Addendum)
01/29/2016 - 01/31/2016  1:59 AM  PATIENT:  Brian Baxter  60 y.o. 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 Bosie HelperEarly's operative note. After his initial exploration, I evaluated the bowel. The small bowel was run from the terminal ileum back to the ligament of Treitz. There was a segment of necrotic small bowel in the distal ileum. This was only about 15 cm in length and had not perforated. It was clearly necrotic. The remainder of the small bowel appeared viable. Right colon, transverse colon, left colon, sigmoid colon, and rectosigmoid also all appeared viable. Decision was made to perform a segmental small bowel resection. The distal ileum was divided proximal and distal to the necrotic area with GIA-75 stapler. The mesentery was then taken down between [facility] clamps and ligated with sutures. Additional suture ligatures were placed to get good hemostasis. The stapled ends were viable. The specimen was sent to pathology. We reinspected the small bowel and it remained viable. Orogastric tube was repositioned in the stomach. Decision was made to temporarily close the abdomen with an open abdomen VAC device and return for second look. The abdomen was irrigated with warm saline. Hemostasis was ensured. The AbThera open abdomen VAC was then applied in standard fashion. The inner  drape was tucked around the bowel completely. Pupils sponges were fashioned on top of that fall by the Vac drapes. This was hooked up to suction and excellent seal was obtained. This completed the procedure. All counts were correct. He was taken directly to the intensive care unit on the ventilator. There were no apparent complications. PATIENT DISPOSITION:  ICU - intubated and critically ill.   Delay start of Pharmacological VTE agent (>24hrs) due to surgical blood loss or risk of bleeding:  no  Violeta GelinasBurke Kariana Wiles, MD, MPH, FACS Pager: [phone]  6/6/20171:59 AM

## 2016-01-31 NOTE — Progress Notes (Signed)
Pharmacy Antibiotic Note  Brian Baxter is a 60 y.o. male admitted on 02/21/2016 with peritonitis.  Pharmacy has been consulted for ertapenem dosing.  Plan: Ertapenem 1g IV Q24H.  Height: 5\' 9"  (175.3 cm) Weight: 164 lb 3.9 oz (74.5 kg) IBW/kg (Calculated) : 70.7  Temp (24hrs), Avg:98.2 F (36.8 C), Min:98.2 F (36.8 C), Max:98.2 F (36.8 C)   Recent Labs Lab 02/12/2016 2207 01/31/16 0230  WBC 18.3* 17.0*  CREATININE 0.86 0.97  LATICACIDVEN 0.8 0.9    Estimated Creatinine Clearance: 82 mL/min (by C-G formula based on Cr of 0.97).    Allergies  Allergen Reactions  . Penicillins Hives     Thank you for allowing pharmacy to be a part of this patient's care.  Brian Baxter, PharmD, BCPS  01/31/2016 3:31 AM

## 2016-01-31 NOTE — Anesthesia Postprocedure Evaluation (Signed)
Anesthesia Post Note  Patient: Brian Baxter  Procedure(s) Performed: Procedure(s) (LRB): SMALL BOWEL RESECTION (N/A) EXPLORATORY LAPAROTOMY (N/A)  Patient location during evaluation: SICU Anesthesia Type: General Level of consciousness: patient remains intubated per anesthesia plan Vital Signs Assessment: post-procedure vital signs reviewed and stable Respiratory status: patient on ventilator - see flowsheet for VS and patient remains intubated per anesthesia plan Cardiovascular status: blood pressure returned to baseline Anesthetic complications: no    Last Vitals:  Filed Vitals:   August 11, 2016 2040  BP: 114/73  Pulse: 86  Temp: 36.8 C  Resp: 24    Last Pain: There were no vitals filed for this visit.               Kaena Santori COKER

## 2016-01-31 NOTE — Progress Notes (Signed)
1 Day Post-Op  Subjective: He is awake on the vent.  Mittens in place.  He is putting out around 50 ml/hr from foley.  Wound vac in place, plan to take him back to OR tomorrow.    Objective: Vital signs in last 24 hours: Temp:  [98.1 F (36.7 C)-99 F (37.2 C)] 99 F (37.2 C) (06/06 0700) Pulse Rate:  [54-118] 75 (06/06 0830) Resp:  [12-44] 16 (06/06 0830) BP: (83-165)/(52-109) 129/72 mmHg (06/06 0830) SpO2:  [91 %-100 %] 98 % (06/06 0830) Arterial Line BP: (84-158)/(39-87) 115/59 mmHg (06/06 0700) FiO2 (%):  [50 %-80 %] 50 % (06/06 0830) Weight:  [74.5 kg (164 lb 3.9 oz)-77.7 kg (171 lb 4.8 oz)] 77.7 kg (171 lb 4.8 oz) (06/06 0545)   2379 IV 405 urine Hypotension is better on Neo K+ replaced, creatinine is stable, WBC is stable  Intake/Output from previous day: 06/05 0701 - 06/06 0700 In: 2379.8 [I.V.:2129.8; IV Piggyback:250] Out: 555 [Urine:405; Drains:100; Blood:50] Intake/Output this shift: Total I/O In: -  Out: 75 [Urine:75]  General appearance: alert and on Vent and in mittens, he triet to pull tubes earlier.   Resp: clear to auscultation bilaterally and anterior exam GI: wound vac in place, no bowel sounds.  Lab Results:   Recent Labs  01/31/16 0230 01/31/16 0505  WBC 17.0* 16.4*  HGB 11.6* 11.1*  HCT 34.2* 32.7*  PLT 224 216    BMET  Recent Labs  01/31/16 0230 01/31/16 0505  NA 134* 134*  K 3.3* 4.2  CL 104 103  CO2 21* 22  GLUCOSE 128* 138*  BUN 18 17  CREATININE 0.97 0.82  CALCIUM 8.2* 8.1*   PT/INR  Recent Labs  01/31/16 0230 01/31/16 0505  LABPROT 15.2 15.8*  INR 1.18 1.25     Recent Labs Lab 02/19/2016 2207 01/31/16 0505  AST 28 25  ALT 34 32  ALKPHOS 70 59  BILITOT 0.9 0.5  PROT 7.3 6.0*  ALBUMIN 2.9* 2.5*     Lipase  No results found for: LIPASE   Studies/Results: Portable Chest Xray  01/31/2016  CLINICAL DATA:  60 year old male with acute respiratory failure and hypoxia. Status post exploratory laparotomy. EXAM:  PORTABLE CHEST 1 VIEW COMPARISON:  None FINDINGS: Endotracheal tube with tip approximately 2.3 cm above the carina. Recommend retraction by approximately 3- 4 cm for optimal positioning. An enteric tube is partially visualized coursing to the left in the abdomen. There is a right IJ central venous line with tip over central SVC. There is atelectatic changes at the left lung base. No focal consolidation. No significant pleural effusion. No pneumothorax. Top-normal cardiac size. No acute osseous pathology. IMPRESSION: Endotracheal tube above the carina. Left lung base atelectatic changes. No focal consolidation. No pneumothorax. Electronically Signed   By: Anner Crete M.D.   On: 01/31/2016 03:29    Medications: . antiseptic oral rinse  7 mL Mouth Rinse QID  . aspirin  300 mg Rectal Daily  . chlorhexidine gluconate (SAGE KIT)  15 mL Mouth Rinse BID  . [START ON 02/11/2016] docusate sodium  100 mg Oral Daily  . ertapenem  1 g Intravenous Q24H  . famotidine (PEPCID) IV  20 mg Intravenous Q12H  . nitroGLYCERIN  0-200 mcg/min Intravenous To OR  . pantoprazole  40 mg Oral Daily  . sodium chloride flush  3 mL Intravenous Q12H   . dextrose 5 % and 0.45 % NaCl with KCl 20 mEq/L 125 mL/hr at 01/31/16 0700  . fentaNYL infusion  INTRAVENOUS 250 mcg/hr (01/31/16 0754)  . 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 Lavone Neri Thompson/Dr. Sherren Mocha Early Post op hypotension CVA 01/25/16 at [facility] Hx of Hypertension Renal infarct FEN: NPO/IV fluids  ID:  Day 2 ertapenem DVT:  SCD only    Plan:  Continue support, we plan to take him back to the OR tomorrow.  Dr. Brantley Stage with discuss with family later today.   LOS: 1 day    Ameera Tigue 01/31/2016 [phone]

## 2016-01-31 NOTE — Progress Notes (Signed)
Initial Nutrition Assessment  DOCUMENTATION CODES:   Not applicable  INTERVENTION:    Recommend nutrition support initiation within next 24-48 hours (EN vs TPN)  NUTRITION DIAGNOSIS:   Inadequate oral intake related to inability to eat as evidenced by NPO status  GOAL:   Patient will meet greater than or equal to 90% of their needs  MONITOR:   Vent status, Labs, Weight trends, Skin, I & O's  REASON FOR ASSESSMENT:   Ventilator  ASSESSMENT:   60 yo Male with PMH significant for hypertension and tobacco abuse [facility] in the setting of small bowel ischemia and possible right kidney ischemia.  Patient s/p procedures 6/6: SMALL BOWEL RESECTION EXPLORATORY LAPAROTOMY OPEN ABDOMEN VACUUM ASSISTED CLOSURE  Patient is currently intubated on ventilator support >> OGT in place MV: 9.3 L/min Temp (24hrs), Avg:98.4 F (36.9 C), Min:98.1 F (36.7 C), Max:99 F (37.2 C)   Pre-op dx: SMA occlusion and ischemic bowel. Pt awake on vent >> mittens on. Vascular & Surgery notes reviewed >> pt remains hemodynamically stable. Plan for OR return tomorrow for hopeful saphenous vein bypass.  Diet Order:  Diet NPO time specified  Skin:  Wound (see comment) (abdominal wound VAC)  Last BM:  N/A  Height:   Ht Readings from Last 1 Encounters:  02/02/2016 5\' 9"  (1.753 m)    Weight:   Wt Readings from Last 1 Encounters:  01/31/16 171 lb 4.8 oz (77.7 kg)    Ideal Body Weight:  73 kg  BMI:  Body mass index is 25.28 kg/(m^2).  Estimated Nutritional Needs:   Kcal:  1813  Protein:  145-155 gm  Fluid:  per MD  EDUCATION NEEDS:   No education needs identified at this time  Maureen ChattersKatie Lyrick Worland, RD, LDN Pager #: [phone] After-Hours Pager #: [phone]

## 2016-01-31 NOTE — Transfer of Care (Signed)
Immediate Anesthesia Transfer of Care Note  Patient: Brian Baxter  Procedure(s) Performed: Procedure(s): SMALL BOWEL RESECTION (N/A) EXPLORATORY LAPAROTOMY (N/A)  Patient Location: SICU  Anesthesia Type:General  Level of Consciousness: Patient remains intubated per anesthesia plan  Airway & Oxygen Therapy: Patient placed on Ventilator (see vital sign flow sheet for setting)  Post-op Assessment: Report given to RN and Post -op Vital signs reviewed and stable  Post vital signs: Reviewed and stable  Last Vitals:  Filed Vitals:   02/07/2016 2040  BP: 114/73  Pulse: 86  Temp: 36.8 C  Resp: 24    Last Pain: There were no vitals filed for this visit.       Complications: No apparent anesthesia complications

## 2016-02-01 ENCOUNTER — Inpatient Hospital Stay (HOSPITAL_COMMUNITY): Payer: BLUE CROSS/BLUE SHIELD | Admitting: Certified Registered Nurse Anesthetist

## 2016-02-01 ENCOUNTER — Encounter (HOSPITAL_COMMUNITY): Admission: AD | Disposition: E | Payer: Self-pay | Source: Other Acute Inpatient Hospital | Attending: Vascular Surgery

## 2016-02-01 ENCOUNTER — Encounter (HOSPITAL_COMMUNITY): Payer: Self-pay | Admitting: Certified Registered Nurse Anesthetist

## 2016-02-01 ENCOUNTER — Inpatient Hospital Stay (HOSPITAL_COMMUNITY): Payer: BLUE CROSS/BLUE SHIELD

## 2016-02-01 DIAGNOSIS — I63443 Cerebral infarction due to embolism of bilateral cerebellar arteries: Secondary | ICD-10-CM

## 2016-02-01 DIAGNOSIS — K55069 Acute infarction of intestine, part and extent unspecified: Secondary | ICD-10-CM

## 2016-02-01 DIAGNOSIS — J9601 Acute respiratory failure with hypoxia: Secondary | ICD-10-CM

## 2016-02-01 HISTORY — PX: BOWEL RESECTION: SHX1257

## 2016-02-01 HISTORY — PX: LAPAROTOMY: SHX154

## 2016-02-01 HISTORY — PX: MESENTERIC ARTERY BYPASS: SHX5968

## 2016-02-01 HISTORY — PX: APPLICATION OF WOUND VAC: SHX5189

## 2016-02-01 LAB — BLOOD GAS, ARTERIAL
ACID-BASE DEFICIT: 1.9 mmol/L (ref 0.0–2.0)
Bicarbonate: 23.4 mEq/L (ref 20.0–24.0)
FIO2: 0.5
MECHVT: 570 mL
O2 SAT: 98 %
PATIENT TEMPERATURE: 98.6
PCO2 ART: 47.2 mmHg — AB (ref 35.0–45.0)
PEEP: 5 cmH2O
PH ART: 7.316 — AB (ref 7.350–7.450)
PO2 ART: 121 mmHg — AB (ref 80.0–100.0)
RATE: 14 resp/min
TCO2: 24.8 mmol/L (ref 0–100)

## 2016-02-01 LAB — POCT I-STAT 7, (LYTES, BLD GAS, ICA,H+H)
ACID-BASE DEFICIT: 4 mmol/L — AB (ref 0.0–2.0)
Acid-base deficit: 4 mmol/L — ABNORMAL HIGH (ref 0.0–2.0)
Bicarbonate: 21.5 mEq/L (ref 20.0–24.0)
Bicarbonate: 21.8 mEq/L (ref 20.0–24.0)
CALCIUM ION: 1.11 mmol/L — AB (ref 1.12–1.23)
Calcium, Ion: 1.11 mmol/L — ABNORMAL LOW (ref 1.12–1.23)
HCT: 31 % — ABNORMAL LOW (ref 39.0–52.0)
HEMATOCRIT: 25 % — AB (ref 39.0–52.0)
HEMOGLOBIN: 8.5 g/dL — AB (ref 13.0–17.0)
HEMOGLOBIN: 10.5 g/dL — AB (ref 13.0–17.0)
O2 SAT: 100 %
O2 Saturation: 100 %
PCO2 ART: 39 mmHg (ref 35.0–45.0)
PH ART: 7.342 — AB (ref 7.350–7.450)
PO2 ART: 275 mmHg — AB (ref 80.0–100.0)
POTASSIUM: 4 mmol/L (ref 3.5–5.1)
Potassium: 4.6 mmol/L (ref 3.5–5.1)
SODIUM: 135 mmol/L (ref 135–145)
Sodium: 137 mmol/L (ref 135–145)
TCO2: 23 mmol/L (ref 0–100)
TCO2: 23 mmol/L (ref 0–100)
pCO2 arterial: 40.3 mmHg (ref 35.0–45.0)
pH, Arterial: 7.35 (ref 7.350–7.450)
pO2, Arterial: 285 mmHg — ABNORMAL HIGH (ref 80.0–100.0)

## 2016-02-01 LAB — POCT I-STAT 4, (NA,K, GLUC, HGB,HCT)
Glucose, Bld: 140 mg/dL — ABNORMAL HIGH (ref 65–99)
HEMATOCRIT: 25 % — AB (ref 39.0–52.0)
HEMOGLOBIN: 8.5 g/dL — AB (ref 13.0–17.0)
POTASSIUM: 4.3 mmol/L (ref 3.5–5.1)
SODIUM: 135 mmol/L (ref 135–145)

## 2016-02-01 LAB — CBC
HCT: 31.7 % — ABNORMAL LOW (ref 39.0–52.0)
Hemoglobin: 10.7 g/dL — ABNORMAL LOW (ref 13.0–17.0)
MCH: 30.8 pg (ref 26.0–34.0)
MCHC: 33.8 g/dL (ref 30.0–36.0)
MCV: 91.4 fL (ref 78.0–100.0)
Platelets: 179 10*3/uL (ref 150–400)
RBC: 3.47 MIL/uL — ABNORMAL LOW (ref 4.22–5.81)
RDW: 13.2 % (ref 11.5–15.5)
WBC: 17.4 10*3/uL — ABNORMAL HIGH (ref 4.0–10.5)

## 2016-02-01 LAB — BASIC METABOLIC PANEL
Anion gap: 8 (ref 5–15)
BUN: 10 mg/dL (ref 6–20)
CHLORIDE: 103 mmol/L (ref 101–111)
CO2: 24 mmol/L (ref 22–32)
CREATININE: 0.63 mg/dL (ref 0.61–1.24)
Calcium: 8.1 mg/dL — ABNORMAL LOW (ref 8.9–10.3)
GFR calc Af Amer: >60 mL/min (ref >60)
GFR calc non Af Amer: >60 mL/min (ref >60)
GLUCOSE: 144 mg/dL — AB (ref 65–99)
POTASSIUM: 4.1 mmol/L (ref 3.5–5.1)
SODIUM: 135 mmol/L (ref 135–145)

## 2016-02-01 LAB — POCT ACTIVATED CLOTTING TIME: Activated Clotting Time: 114 seconds

## 2016-02-01 LAB — PREPARE RBC (CROSSMATCH)

## 2016-02-01 LAB — MAGNESIUM: MAGNESIUM: 2.1 mg/dL (ref 1.7–2.4)

## 2016-02-01 LAB — PROTEIN S, TOTAL: Protein S Ag, Total: 179 % — ABNORMAL HIGH (ref 60–150)

## 2016-02-01 LAB — PHOSPHORUS: PHOSPHORUS: 2.6 mg/dL (ref 2.5–4.6)

## 2016-02-01 LAB — PROTIME-INR
INR: 1.29 (ref 0.00–1.49)
PROTHROMBIN TIME: 16.2 s — AB (ref 11.6–15.2)

## 2016-02-01 LAB — PROTEIN S ACTIVITY: Protein S Activity: 118 % (ref 63–140)

## 2016-02-01 LAB — PROTEIN C, TOTAL: Protein C, Total: 57 % — ABNORMAL LOW (ref 60–150)

## 2016-02-01 LAB — PROTEIN C ACTIVITY: PROTEIN C ACTIVITY: 76 % (ref 73–180)

## 2016-02-01 LAB — HOMOCYSTEINE: Homocysteine: 9.2 umol/L (ref 0.0–15.0)

## 2016-02-01 LAB — APTT: aPTT: 39 seconds — ABNORMAL HIGH (ref 24–37)

## 2016-02-01 SURGERY — CREATION, BYPASS, ARTERIAL, MESENTERIC
Anesthesia: General | Site: Abdomen

## 2016-02-01 SURGERY — LAPAROTOMY, EXPLORATORY
Anesthesia: General

## 2016-02-01 MED ORDER — FENTANYL CITRATE (PF) 250 MCG/5ML IJ SOLN
INTRAMUSCULAR | Status: DC | PRN
  Administered 2016-02-01: 150 ug via INTRAVENOUS
  Administered 2016-02-01: 100 ug via INTRAVENOUS

## 2016-02-01 MED ORDER — PHENYLEPHRINE HCL 10 MG/ML IJ SOLN
INTRAMUSCULAR | Status: DC | PRN
  Administered 2016-02-01 (×4): 80 ug via INTRAVENOUS

## 2016-02-01 MED ORDER — HYDROMORPHONE HCL 1 MG/ML IJ SOLN
INTRAMUSCULAR | Status: AC
  Filled 2016-02-01: qty 1

## 2016-02-01 MED ORDER — PROTAMINE SULFATE 10 MG/ML IV SOLN
INTRAVENOUS | Status: AC
  Filled 2016-02-01: qty 10

## 2016-02-01 MED ORDER — SODIUM CHLORIDE 0.9 % IV SOLN
Freq: Once | INTRAVENOUS | Status: AC
  Administered 2016-02-03: 11:00:00 via INTRAVENOUS

## 2016-02-01 MED ORDER — MIDAZOLAM HCL 2 MG/2ML IJ SOLN
INTRAMUSCULAR | Status: AC
  Filled 2016-02-01: qty 2

## 2016-02-01 MED ORDER — PHENYLEPHRINE 40 MCG/ML (10ML) SYRINGE FOR IV PUSH (FOR BLOOD PRESSURE SUPPORT)
PREFILLED_SYRINGE | INTRAVENOUS | Status: AC
  Filled 2016-02-01: qty 20

## 2016-02-01 MED ORDER — ESMOLOL HCL 100 MG/10ML IV SOLN
INTRAVENOUS | Status: DC | PRN
  Administered 2016-02-01: 30 mg via INTRAVENOUS

## 2016-02-01 MED ORDER — MIDAZOLAM HCL 5 MG/5ML IJ SOLN
INTRAMUSCULAR | Status: DC | PRN
  Administered 2016-02-01: 2 mg via INTRAVENOUS

## 2016-02-01 MED ORDER — PROTAMINE SULFATE 10 MG/ML IV SOLN
INTRAVENOUS | Status: DC | PRN
  Administered 2016-02-01: 50 mg via INTRAVENOUS

## 2016-02-01 MED ORDER — 0.9 % SODIUM CHLORIDE (POUR BTL) OPTIME
TOPICAL | Status: DC | PRN
  Administered 2016-02-01: 3000 mL

## 2016-02-01 MED ORDER — PROTAMINE SULFATE 10 MG/ML IV SOLN
INTRAVENOUS | Status: AC
  Filled 2016-02-01: qty 5

## 2016-02-01 MED ORDER — SODIUM CHLORIDE 0.9 % IJ SOLN
INTRAMUSCULAR | Status: AC
  Filled 2016-02-01: qty 10

## 2016-02-01 MED ORDER — SODIUM CHLORIDE 0.9 % IV SOLN
INTRAVENOUS | Status: DC | PRN
  Administered 2016-02-01: 500 mL

## 2016-02-01 MED ORDER — ALBUMIN HUMAN 5 % IV SOLN
INTRAVENOUS | Status: DC | PRN
  Administered 2016-02-01: 15:00:00 via INTRAVENOUS

## 2016-02-01 MED ORDER — LACTATED RINGERS IV SOLN
INTRAVENOUS | Status: DC | PRN
  Administered 2016-02-01 (×3): via INTRAVENOUS

## 2016-02-01 MED ORDER — ROCURONIUM BROMIDE 50 MG/5ML IV SOLN
INTRAVENOUS | Status: AC
  Filled 2016-02-01: qty 2

## 2016-02-01 MED ORDER — FENTANYL CITRATE (PF) 250 MCG/5ML IJ SOLN
INTRAMUSCULAR | Status: AC
  Filled 2016-02-01: qty 5

## 2016-02-01 MED ORDER — SUGAMMADEX SODIUM 200 MG/2ML IV SOLN
INTRAVENOUS | Status: AC
  Filled 2016-02-01: qty 2

## 2016-02-01 MED ORDER — HYDROMORPHONE HCL 1 MG/ML IJ SOLN
INTRAMUSCULAR | Status: DC | PRN
  Administered 2016-02-01: .3 mg via INTRAVENOUS
  Administered 2016-02-01: .2 mg via INTRAVENOUS
  Administered 2016-02-01: 1 mg via INTRAVENOUS
  Administered 2016-02-01: .5 mg via INTRAVENOUS

## 2016-02-01 MED ORDER — PROPOFOL 10 MG/ML IV BOLUS
INTRAVENOUS | Status: AC
  Filled 2016-02-01: qty 20

## 2016-02-01 MED ORDER — ROCURONIUM BROMIDE 100 MG/10ML IV SOLN
INTRAVENOUS | Status: DC | PRN
  Administered 2016-02-01 (×3): 50 mg via INTRAVENOUS

## 2016-02-01 MED ORDER — SODIUM CHLORIDE 0.9 % IV SOLN
10000.0000 ug | INTRAVENOUS | Status: DC | PRN
  Administered 2016-02-01: 40 ug/min via INTRAVENOUS

## 2016-02-01 MED ORDER — PROPOFOL 10 MG/ML IV BOLUS
INTRAVENOUS | Status: DC | PRN
  Administered 2016-02-01: 100 mg via INTRAVENOUS

## 2016-02-01 SURGICAL SUPPLY — 83 items
BENZOIN TINCTURE PRP APPL 2/3 (GAUZE/BANDAGES/DRESSINGS) ×3 IMPLANT
BLADE SURG ROTATE 9660 (MISCELLANEOUS) IMPLANT
CANISTER SUCTION 2500CC (MISCELLANEOUS) ×3 IMPLANT
CANISTER WOUND CARE 500ML ATS (WOUND CARE) ×3 IMPLANT
CANNULA VESSEL 3MM 2 BLNT TIP (CANNULA) ×6 IMPLANT
CANNULA VESSEL W/WING WO/VALVE (CANNULA) ×3 IMPLANT
CATH EMB 4FR 40CM (CATHETERS) ×3 IMPLANT
CHLORAPREP W/TINT 26ML (MISCELLANEOUS) ×3 IMPLANT
CLIP LIGATING EXTRA MED SLVR (CLIP) ×3 IMPLANT
CLIP LIGATING EXTRA SM BLUE (MISCELLANEOUS) ×3 IMPLANT
CLOSURE STERI-STRIP 1/2X4 (GAUZE/BANDAGES/DRESSINGS) ×1
CLSR STERI-STRIP ANTIMIC 1/2X4 (GAUZE/BANDAGES/DRESSINGS) ×2 IMPLANT
COVER PROBE W GEL 5X96 (DRAPES) ×3 IMPLANT
COVER SURGICAL LIGHT HANDLE (MISCELLANEOUS) ×3 IMPLANT
DRAPE LAPAROSCOPIC ABDOMINAL (DRAPES) IMPLANT
DRAPE WARM FLUID 44X44 (DRAPE) ×3 IMPLANT
DRSG COVADERM 4X8 (GAUZE/BANDAGES/DRESSINGS) ×3 IMPLANT
DRSG OPSITE POSTOP 4X10 (GAUZE/BANDAGES/DRESSINGS) IMPLANT
DRSG OPSITE POSTOP 4X8 (GAUZE/BANDAGES/DRESSINGS) IMPLANT
ELECT BLADE 4.0 EZ CLEAN MEGAD (MISCELLANEOUS) ×3
ELECT BLADE 6.5 EXT (BLADE) IMPLANT
ELECT CAUTERY BLADE 6.4 (BLADE) IMPLANT
ELECT REM PT RETURN 9FT ADLT (ELECTROSURGICAL) ×9
ELECTRODE BLDE 4.0 EZ CLN MEGD (MISCELLANEOUS) ×1 IMPLANT
ELECTRODE REM PT RTRN 9FT ADLT (ELECTROSURGICAL) ×3 IMPLANT
GLOVE BIO SURGEON STRL SZ 6.5 (GLOVE) ×4 IMPLANT
GLOVE BIO SURGEON STRL SZ8 (GLOVE) ×6 IMPLANT
GLOVE BIO SURGEONS STRL SZ 6.5 (GLOVE) ×2
GLOVE BIOGEL PI IND STRL 6.5 (GLOVE) ×4 IMPLANT
GLOVE BIOGEL PI IND STRL 8 (GLOVE) ×4 IMPLANT
GLOVE BIOGEL PI INDICATOR 6.5 (GLOVE) ×8
GLOVE BIOGEL PI INDICATOR 8 (GLOVE) ×8
GLOVE ECLIPSE 6.5 STRL STRAW (GLOVE) ×9 IMPLANT
GLOVE SS BIOGEL STRL SZ 7 (GLOVE) ×1 IMPLANT
GLOVE SS BIOGEL STRL SZ 7.5 (GLOVE) ×1 IMPLANT
GLOVE SUPERSENSE BIOGEL SZ 7 (GLOVE) ×2
GLOVE SUPERSENSE BIOGEL SZ 7.5 (GLOVE) ×2
GOWN STRL REUS W/ TWL LRG LVL3 (GOWN DISPOSABLE) ×8 IMPLANT
GOWN STRL REUS W/ TWL XL LVL3 (GOWN DISPOSABLE) IMPLANT
GOWN STRL REUS W/TWL LRG LVL3 (GOWN DISPOSABLE) ×16
GOWN STRL REUS W/TWL XL LVL3 (GOWN DISPOSABLE)
INSERT FOGARTY 61MM (MISCELLANEOUS) ×3 IMPLANT
INSERT FOGARTY SM (MISCELLANEOUS) ×6 IMPLANT
KIT BASIN OR (CUSTOM PROCEDURE TRAY) ×3 IMPLANT
KIT ROOM TURNOVER OR (KITS) ×6 IMPLANT
LIGASURE IMPACT 36 18CM CVD LR (INSTRUMENTS) ×3 IMPLANT
NS IRRIG 1000ML POUR BTL (IV SOLUTION) ×6 IMPLANT
PACK AORTA (CUSTOM PROCEDURE TRAY) ×3 IMPLANT
PACK GENERAL/GYN (CUSTOM PROCEDURE TRAY) IMPLANT
PAD ARMBOARD 7.5X6 YLW CONV (MISCELLANEOUS) ×6 IMPLANT
PAD NEG PRESSURE SENSATRAC (MISCELLANEOUS) ×3 IMPLANT
PUNCH AORTIC ROTATE 5MM 8IN (MISCELLANEOUS) ×3 IMPLANT
SPECIMEN JAR LARGE (MISCELLANEOUS) IMPLANT
SPONGE ABDOMINAL VAC ABTHERA (MISCELLANEOUS) ×3 IMPLANT
SPONGE LAP 18X18 X RAY DECT (DISPOSABLE) IMPLANT
STAPLER PROXIMATE 75MM BLUE (STAPLE) ×3 IMPLANT
STAPLER VISISTAT 35W (STAPLE) IMPLANT
SUCTION POOLE TIP (SUCTIONS) IMPLANT
SUT ETHIBOND 5 LR DA (SUTURE) IMPLANT
SUT PDS AB 1 TP1 54 (SUTURE) ×6 IMPLANT
SUT PDS AB 1 TP1 96 (SUTURE) IMPLANT
SUT PROLENE 3 0 SH1 36 (SUTURE) ×3 IMPLANT
SUT PROLENE 5 0 C 1 24 (SUTURE) IMPLANT
SUT PROLENE 5 0 C 1 36 (SUTURE) ×6 IMPLANT
SUT PROLENE 6 0 CC (SUTURE) ×21 IMPLANT
SUT SILK 2 0 SH CR/8 (SUTURE) ×3 IMPLANT
SUT SILK 3 0 (SUTURE) ×2
SUT SILK 3-0 18XBRD TIE 12 (SUTURE) ×1 IMPLANT
SUT VIC AB 2-0 CT1 36 (SUTURE) ×6 IMPLANT
SUT VIC AB 2-0 SH 18 (SUTURE) IMPLANT
SUT VIC AB 3-0 SH 18 (SUTURE) IMPLANT
SUT VIC AB 3-0 SH 27 (SUTURE) ×4
SUT VIC AB 3-0 SH 27X BRD (SUTURE) ×2 IMPLANT
SUT VICRYL AB 2 0 TIES (SUTURE) IMPLANT
SUT VICRYL AB 3 0 TIES (SUTURE) IMPLANT
SYRINGE 3CC LL L/F (MISCELLANEOUS) ×3 IMPLANT
TOWEL BLUE STERILE X RAY DET (MISCELLANEOUS) ×9 IMPLANT
TOWEL OR 17X24 6PK STRL BLUE (TOWEL DISPOSABLE) IMPLANT
TOWEL OR 17X26 10 PK STRL BLUE (TOWEL DISPOSABLE) ×3 IMPLANT
TRAY FOLEY CATH 16FRSI W/METER (SET/KITS/TRAYS/PACK) IMPLANT
TRAY FOLEY W/METER SILVER 16FR (SET/KITS/TRAYS/PACK) IMPLANT
WATER STERILE IRR 1000ML POUR (IV SOLUTION) ×9 IMPLANT
YANKAUER SUCT BULB TIP NO VENT (SUCTIONS) IMPLANT

## 2016-02-01 NOTE — Progress Notes (Signed)
I received a phone call from Dr. Delton CoombesByrum requesting consult. The patient is currently in the OR. I plan to see him tomorrow. I will order additional workup

## 2016-02-01 NOTE — Transfer of Care (Signed)
Immediate Anesthesia Transfer of Care Note  Patient: Brian Baxter  Procedure(s) Performed: Procedure(s): INFRARENAL AORTA-SUPERIOR MESENTERIC ARTERY BYPASS USING LEFT GREATER SAPHENOUS VEIN (N/A) EXPLORATORY LAPAROTOMY (N/A)  SMALL BOWEL RESECTION (N/A) APPLICATION OF WOUND VAC (N/A)  Patient Location: ICU  Anesthesia Type:General  Level of Consciousness: unresponsive and Patient remains intubated per anesthesia plan  Airway & Oxygen Therapy: Patient remains intubated per anesthesia plan and Patient placed on Ventilator (see vital sign flow sheet for setting)  Post-op Assessment: Report given to RN and Post -op Vital signs reviewed and stable  Post vital signs: Reviewed and stable  Last Vitals:  Filed Vitals:   07-08-2016 1226 07-08-2016 1707  BP:  122/60  Pulse:  72  Temp: 37.6 C   Resp:  14    Last Pain: There were no vitals filed for this visit.       Complications: No apparent anesthesia complications

## 2016-02-01 NOTE — Progress Notes (Signed)
Subjective: Interval History: none..   Objective: Vital signs in last 24 hours: Temp:  [98.8 F (37.1 C)-100.5 F (38.1 C)] 99.7 F (37.6 C) (06/07 1226) Pulse Rate:  [68-105] 72 (06/07 1707) Resp:  [13-21] 14 (06/07 1707) BP: (100-154)/(54-75) 122/60 mmHg (06/07 1707) SpO2:  [96 %-100 %] 99 % (06/07 1710) Arterial Line BP: (96-136)/(45-63) 136/63 mmHg (06/07 1700) FiO2 (%):  [50 %] 50 % (06/07 1707) Weight:  [167 lb 12.3 oz (76.1 kg)] 167 lb 12.3 oz (76.1 kg) (06/07 0500)  Intake/Output from previous day: 06/06 0701 - 06/07 0700 In: 3886.2 [I.V.:3566.2; NG/GT:90; IV Piggyback:150] Out: 2100 [Urine:1300; Emesis/NG output:150; Drains:650] Intake/Output this shift: Total I/O In: 4725 [I.V.:3725; Blood:670; NG/GT:30; IV Piggyback:300] Out: 1855 [Urine:455; Emesis/NG output:150; Drains:150; Blood:1100]  VAC dressing intact. 2+ dorsalis pedis pulses bilaterally  Lab Results:  Recent Labs  01/31/16 1455 01/28/2016 0400  02/11/2016 1614 01/27/2016 1655  WBC 14.8* 17.4*  --   --   --   HGB 11.0* 10.7*  < > 8.5* 10.5*  HCT 32.7* 31.7*  < > 25.0* 31.0*  PLT 196 179  --   --   --   < > = values in this interval not displayed. BMET  Recent Labs  01/31/16 0505 01/31/2016 0400  02/13/2016 1614 02/04/2016 1655  NA 134* 135  < > 135 135  K 4.2 4.1  < > 4.3 4.6  CL 103 103  --   --   --   CO2 22 24  --   --   --   GLUCOSE 138* 144*  --  140*  --   BUN 17 10  --   --   --   CREATININE 0.82 0.63  --   --   --   CALCIUM 8.1* 8.1*  --   --   --   < > = values in this interval not displayed.  Studies/Results: Dg Chest Port 1 View  02/08/2016  CLINICAL DATA:  Abdominal surgery, endotracheal tube placement EXAM: PORTABLE CHEST 1 VIEW COMPARISON:  Portable chest x-ray of 01/31/2016 and February 05, 2016, CT chest of 01/24/2006 FINDINGS: There has been and increase in bibasilar opacities left-greater-than-right most consistent with atelectasis and possible effusion on the left. Pneumonia particularly  the left lung base cannot be excluded. The tip of the endotracheal tube is approximately 4.3 cm above the carina. Right IJ central venous line tip overlies the mid SVC and the NG tube extends below the hemidiaphragm. IMPRESSION: 1. Increasing basilar opacities left-greater-than-right most consistent with atelectasis and possible left effusion. Cannot exclude pneumonia at the left lung base. 2. Tip of endotracheal tube 4.3 cm above the carina. Electronically Signed   By: Dwyane DeePaul  Barry M.D.   On: 02/15/2016 08:21   Portable Chest Xray  01/31/2016  CLINICAL DATA:  60 year old male with acute respiratory failure and hypoxia. Status post exploratory laparotomy. EXAM: PORTABLE CHEST 1 VIEW COMPARISON:  None FINDINGS: Endotracheal tube with tip approximately 2.3 cm above the carina. Recommend retraction by approximately 3- 4 cm for optimal positioning. An enteric tube is partially visualized coursing to the left in the abdomen. There is a right IJ central venous line with tip over central SVC. There is atelectatic changes at the left lung base. No focal consolidation. No significant pleural effusion. No pneumothorax. Top-normal cardiac size. No acute osseous pathology. IMPRESSION: Endotracheal tube above the carina. Left lung base atelectatic changes. No focal consolidation. No pneumothorax. Electronically Signed   By: Ceasar MonsArash  Radparvar M.D.  On: 01/31/2016 03:29   Anti-infectives: Anti-infectives    Start     Dose/Rate Route Frequency Ordered Stop   01/31/16 0400  ertapenem (INVANZ) 1 g in sodium chloride 0.9 % 50 mL IVPB     1 g 100 mL/hr over 30 Minutes Intravenous Every 24 hours 01/31/16 0334     01/31/16 0100  [MAR Hold]  cefOXitin (MEFOXIN) 2 g in dextrose 5 % 50 mL IVPB     (MAR Hold since 01/31/16 0049)   2 g 100 mL/hr over 30 Minutes Intravenous To Surgery 01/31/16 0046 01/31/16 0118      Assessment/Plan: s/p Procedure(s): INFRARENAL AORTA-SUPERIOR MESENTERIC ARTERY BYPASS USING LEFT GREATER  SAPHENOUS VEIN (N/A) EXPLORATORY LAPAROTOMY (N/A)  SMALL BOWEL RESECTION (N/A) APPLICATION OF WOUND VAC (N/A) Stable postop   LOS: 2 days   Gretta Began 02-07-16, 5:37 PM

## 2016-02-01 NOTE — Op Note (Signed)
Preoperative diagnosis: Small bowel ischemia  Postoperative diagnosis: Same  Procedure: Exploratory laparotomy with small bowel resection and placement of abdominal vacuum pack dressing  Surgeon: Harriette Bouillonhomas Chukwuebuka Churchill M.D.  Anesthesia: Gen.  EBL: Minimal  Specimens: Approximately 2 feet of ileum to pathology  Indications for procedure: The patient's a 60 year old male admitted 2 days ago with abdominal pain and small bowel ischemia. He underwent exploratory laparotomy 2 days ago with small bowel resection and his abdomen was left open. 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 He presents today for re-vascularization by Dr. Arbie CookeyEarly and possible bowel resection if any residual ischemic sections are encountered.  Description of procedure: The patient was brought from the intensive care unit intubated and sedated. He was initially brought into the operating room and placed on the table supine. After appropriate levels of general anesthesia were initiated, his wound VAC was removed. There is an ischemic distal ileum to the staple line. The terminal ileum that was viable to the cecum. At this point in time, Dr. early scrubbed in and performed his aortomesenteric bypass. After this was done, I scrubbed back in the case. I reexamined the small bowel in the ileum was severely ischemic. This required resection. I measured from the ligament of Treitz and had about 110 cm the small bowel was viable. The ileum was present and there is another 20 cm of terminal ileum present. The ileum was resected with the LigaSure and a GIA 75 stapling device. The ends were left stable closed. We will return in 48 hours for exploration and hopefully anastomosis and abdominal closure. All final counts were found to be correct. The wound VAC was replaced using standard technique and placed 125 mm suction. There was a good seal. He is taken back to the ICU  intubated in critical condition.-*

## 2016-02-01 NOTE — Op Note (Signed)
OPERATIVE REPORT  DATE OF SURGERY: 02/16/2016  PATIENT: Brian Baxter, 60 y.o. male MRN: [id number]  DOB: 10-26-1955  PRE-OPERATIVE DIAGNOSIS: Intestinal ischemia  POST-OPERATIVE DIAGNOSIS:  Same  PROCEDURE: Second look exploratory laparotomy followed by infrarenal aorta to superior mesenteric artery bypass with saphenous vein and superior mesenteric artery thrombectomy  SURGEON:  Gretta Beganodd Trai Ells, M.D.  PHYSICIAN ASSISTANT: Dr. Hart RochesterLawson and kim Trinh PA-C  ANESTHESIA:  Gen.    Total I/O In: 3825 [I.V.:2825; Blood:670; NG/GT:30; IV Piggyback:300] Out: 1855 [Urine:455; Emesis/NG output:150; Drains:150; Blood:1100]  BLOOD ADMINISTERED: 2 units   DRAINS: None  SPECIMEN: Small bowel resected per general surgery  COUNTS CORRECT:  YES  PLAN OF CARE: Intensive care unit intubated   PATIENT DISPOSITION:  PACU - hemodynamically stable  PROCEDURE DETAILS: Patient status post filter laparotomy and resection of the nonviable small bowel 48 hours ago. Brought back for second look today and revascularization. Abdominal VAC was removed. Dr. Luisa Hartornett was present and would dictate a separate note. The distal portion of the ileum was not viable but the jejunum stomach and colon were all viable. The aorta was exposed at the level of the diaphragm by reflecting the left lobe of the liver superiorly and opening the crus of diaphragm. The aorta had posterior plaque this area but was possible for anastomosis. Next the severe mesenteric artery was identified by reflecting the small bowel to the right. The duodenum was mobilized off the aorta and the extremities drug artery was quite thickened but the did appear to be adequate for anastomosis. The artery was exposed over a length adequate for anastomosis near the arterial origin. The infrarenal aorta was extremely calcified but there did appear to be soft area on the anterior wall that was accessed bypass. The aorta was encircled circumferentially just  below the level renal arteries and also lower above the bifurcation. The saphenous vein isn't imaged at the right and left groin. The left groin saphenous vein appeared to be larger. Incision was made at the level of the saphenofemoral junction and the saphenous vein was harvested for an adequate length for bypass. Tributary branches were ligated with 301 4-0 silk ties and divided. The vein was gently dilated with heparinized saline was adequate for bypass. The patient was given 7000 units intravenous heparin. After circulation time the superior mesenteric artery was occluded at its origin with the Henley clamp and further distally with blue vessel loop and a Potts configuration. The artery was opened and there was gray tan appearing material in the artery. This was removed the 4 Fogarty catheter tubing good backbleeding. There was a tight stenosis at the origin of the aorta but this was also passed into the aorta. The vein was brought onto the field and was used as a reversed vein. The graft was sewn end-to-side to the superior mesenteric artery with a running 6-0 Prolene suture. Anastomosis tested and found to be adequate. Next the small bowel and root of the mesentery rule out to retract back towards the aorta to allow approximate sizing to prevent kinking of the vein graft. The aorta was occluded proximally and distally with the heart and clamps. The artery was opened anteriorly and aortic punch was used to remove segment of the anterior wall. There was some oozing from the aorta which could not be controlled clamp. The vein was cut to appropriate length and was sewn end-to-side to the aorta with a running 5-0 Prolene suture. Again the taken prior to completion of this. The anastomosis  completed and clamps removed with excellent Doppler flow to the superior mesenteric artery. Patient was given 50 mg of protamine to reverse the heparin. Wounds irrigated with saline. Hemostasis tablet cautery. The small bowel was  allowed to return back to the pelvis. Dr. Luisa Hart then re-scrubbed and assess the viability of the distal small bowel and did a distal small bowel resection and replacement of the abdominal back which will be dictated as a separate note. The patient was turned to the intensive care unit hemodynamically stable intubated with plans for return the operating room and bowel re-anastomosis and 48 hours   Gretta Began, M.D. 2016-02-13 5:06 PM

## 2016-02-01 NOTE — Progress Notes (Signed)
PULMONARY / CRITICAL CARE MEDICINE   Name: Brian Baxter MRN: [id number] DOB: 10-20-1955    ADMISSION DATE:  02/22/2016 CONSULTATION DATE:  01/31/2016  REFERRING MD:  Dr. Arbie Cookey  CHIEF COMPLAINT:  Abdominal pain  HISTORY OF PRESENT ILLNESS:   60 year old male with a past medical history significant for hypertension and tobacco abuse was admitted to [facility] on 02/20/2016 in the setting of small bowel ischemia and possible right kidney ischemia. He has been admitted to an outside hospital on 01/25/2016 in the setting of visual changes. He was noted on MRI brain to have multiple strokes, one in the right occipital lobe which was the largest. During that hospitalization at the outside hospital he underwent an extensive workup to look for an etiology of embolic clot which was felt to be the cause of his strokes. He had a TTE with contrast which initially was suggestive of a patent foramen ovale but a follow-up TEE on the following day with contrast showed no evidence of right to left shunt. Both studies showed no evidence of vegetation on the valve. He had an MRI of his abdomen which showed that the right iliac vein was slightly compressed by the overlying iliac artery there was no evidence of venous clots. He also had a lower extremity Doppler ultrasound which was negative. He was discharged on a statin and aspirin instructions to quit smoking.  On 01/29/2016 he developed severe abdominal pain and went to another outside hospital and then was transferred to [place] for further evaluation. He was initially accepted by [place] for consideration of TPA infusion by our interventional radiology group but considering his recent strokes vascular and general surgery were consulted for further evaluation.  He was taken emergently to the operating room on 01/29/2016 where he was found to have ischemic bowel near the ileum. Bowel was resected and no re-anastomosis attempts were made, he was left open with a  wound VAC in place. He was transferred to the intensive care unit intubated on a low-dose of Neo-Synephrine.    SUBJECTIVE:  Sedated on vent Note plans for return to OR today  VITAL SIGNS: BP 154/75 mmHg  Pulse 94  Temp(Src) 100.5 F (38.1 C) (Axillary)  Resp 14  Ht  (1.753 m)  Wt 167 lb 12.3 oz (76.1 kg)  BMI 24.76 kg/m2  SpO2 98%  HEMODYNAMICS:    VENTILATOR SETTINGS: Vent Mode:  [-] PRVC FiO2 (%):  [50 %] 50 % Set Rate:  [14 bmp] 14 bmp Vt Set:  [570 mL] 570 mL PEEP:  [5 cmH20] 5 cmH20 Plateau Pressure:  [15 cmH20-19 cmH20] 16 cmH20  INTAKE / OUTPUT: I/O last 3 completed shifts: In: 6286 [I.V.:5696; Other:100; NG/GT:90; IV Piggyback:400] Out: 2655 [Urine:1705; Emesis/NG output:150; Drains:750; Blood:50]  PHYSICAL EXAMINATION: General:  Sedated on vent, opens eyes to voice, follows commands Neuro:  Opens eyes to voice, follows commands, nods head, moving all 4 extremities HEENT:  Normocephalic/atraumatic, endotracheal tube in place Cardiovascular:  Regular rate and rhythm, no murmurs gallops rubs Lungs:  Clear to auscultation bilaterally with vent supported breaths Abdomen:  No bowel sounds, large wound VAC in place Musculoskeletal:  Normal bulk and tone Skin:  Large wound VAC in place abdomen  LABS:  BMET  Recent Labs Lab 01/31/16 0230 01/31/16 0505 02/02/2016 0400  NA 134* 134* 135  K 3.3* 4.2 4.1  CL 104 103 103  CO2 21* 22 24  BUN CREATININE 0.97 0.82 0.63  GLUCOSE 128* 138*  144*    Electrolytes  Recent Labs Lab 01/31/16 0230 01/31/16 0505 02-17-16 0400  CALCIUM 8.2* 8.1* 8.1*  MG 2.0  --  2.1  PHOS  --  3.0 2.6    CBC  Recent Labs Lab 01/31/16 1215 01/31/16 1455 02-17-2016 0400  WBC 13.5* 14.8* 17.4*  HGB 10.9* 11.0* 10.7*  HCT 32.6* 32.7* 31.7*  PLT 193 196 179    Coag's  Recent Labs Lab 01/31/16 0230 01/31/16 0505 February 17, 2016 0400  APTT 32  --  39*  INR 1.18 1.25 1.29    Sepsis Markers  Recent  Labs Lab 01/29/2016 2207 01/31/16 0230  LATICACIDVEN 0.8 0.9    ABG  Recent Labs Lab 01/31/16 0230 02-17-2016 0420  PHART 7.361 7.316*  PCO2ART 37.5 47.2*  PO2ART 219* 121*    Liver Enzymes  Recent Labs Lab 02/11/2016 2207 01/31/16 0505  AST 28 25  ALT 34 32  ALKPHOS 70 59  BILITOT 0.9 0.5  ALBUMIN 2.9* 2.5*    Cardiac Enzymes No results for input(s): TROPONINI, PROBNP in the last 168 hours.  Glucose No results for input(s): GLUCAP in the last 168 hours.  Imaging Dg Chest Port 1 View  2016/02/17  CLINICAL DATA:  Abdominal surgery, endotracheal tube placement EXAM: PORTABLE CHEST 1 VIEW COMPARISON:  Portable chest x-ray of 01/31/2016 and 02/14/2016, CT chest of 01/24/2006 FINDINGS: There has been and increase in bibasilar opacities left-greater-than-right most consistent with atelectasis and possible effusion on the left. Pneumonia particularly the left lung base cannot be excluded. The tip of the endotracheal tube is approximately 4.3 cm above the carina. Right IJ central venous line tip overlies the mid SVC and the NG tube extends below the hemidiaphragm. IMPRESSION: 1. Increasing basilar opacities left-greater-than-right most consistent with atelectasis and possible left effusion. Cannot exclude pneumonia at the left lung base. 2. Tip of endotracheal tube 4.3 cm above the carina. Electronically Signed   By: Dwyane Dee M.D.   On: 02/17/2016 08:21     STUDIES:  01/26/2016 outside hospital TTE: Left atrial enlargement, contrast suggestive of patent foramen ovale 01/26/2016 bilateral carotid ultrasound: No significant stenosis 01/26/2016 ultrasound Doppler legs>  negative for  DVT 01/27/2016 MR angiogram pelvis: Mild compression of the left common iliac vein without significant narrowing or thrombosis 01/27/2016 outside hospital TTE> Normal valves, left atrial enlargement, there is no abnormal contrast to suggest intra-atrial shunting 6/5 CT abdomen OSH> CT angiogram findings  worrisome for superior mesenteric artery clot, small bowel ischemia, infarcts to kidney and spleen.  CULTURES: None  ANTIBIOTICS: 01/31/2016 ertapenem  SIGNIFICANT EVENTS: June Fifth 2017 CT angiogram findings worrisome for superior mesenteric artery clot, taken to the operating room where several inches of ileum were resected  LINES/TUBES: 02/08/2016 endotracheal tube  DISCUSSION: This is a 60 year old male who has small bowel ischemia, possible renal infarct in the setting of the superior mesenteric artery clot. This occurs several days after recent hospitalization for stroke and what was felt to be an embolic cause of his stroke. The etiology of his underlying hypercoagulable state is uncertain, is still uncertain if there is one common clot leading to embolic phenomena but the extensive workup performed at the outside hospital was suggest otherwise. At this time he remains critically ill after having emergent bowel resection and he is sedated on the ventilator with plans to go back to the operating room in 48 hours for second look and possible vascular intervention.  ASSESSMENT / PLAN:  PULMONARY A: Mechanical ventilatory support postoperative History of  tobacco use ? Left lower lobe pna P:   Full ventilator support For return to OR 6/7 . Keep sedated on vent for now with open abd. 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 Shock postoperatively, likely sedation related P:  Vascular surgery involved in open abd 6/6; possible vascular bypass on 6/7 Telemetry monitoring Hold anticoagulation given recent large ischemic stroke Titrate pressor to maintain mean arterial pressure greater than 65, consider change to levophed due to bradycardia Start hypercoagulable workup Continue aspirin if okay by vascular surgery and general surgery Neurology consult  Consider repeat TEE or TTE  RENAL Lab  Results  Component Value Date   CREATININE 0.63 12-27-15   CREATININE 0.82 01/31/2016   CREATININE 0.97 01/31/2016    A:   Concern for partial right renal infarct on CT angiogram, normal renal function P:   Monitor BMET and UOP Replace electrolytes as needed   GASTROINTESTINAL A:   Small bowel ischemia secondary to superior mesenteric artery clot Post laparotomy with open abd P:   Postoperative care for general surgery Nothing by mouth PPI  HEMATOLOGIC A:   Concern for hypercoagulable state Stents of the workup for embolic cause of multiple arterial strokes negative at outside hospital 3-4 days prior to admission P:  Hypercoagulability panel > AT III level normal, rest is pending Consider hematology consult depending on hypercoag results Hold heparin until okay by neurology, presumably June 13 (14 days post stroke)  INFECTIOUS A:   Peritonitis P:   Ertapenem  ENDOCRINE A:   No acute  issues P:   Monitor glucose  NEUROLOGIC A:   Recent stroke P:   RASS goal: -3 Heavy sedation while abdomen open Fentanyl gtt Versed prn   FAMILY  - Updates: none bedside on my exam  - Inter-disciplinary family meet or Palliative Care meeting due by:  day 7  Steve Minor ACNP [facility] Pager [phone] till 3 pm If no answer page [phone] 01/28/2016, 9:42 AM   Attending Note:  I have examined patient, reviewed labs, studies and notes. I have discussed the case with S Minor, and I agree with the data and plans as amended above. 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 On my eval he is hemodynamically stable, is requiring Fio2 0.40 + PEEP 5. He wakes to voice, then quickly back to sleep. We will plan to continue MV pending trip back to OR today. He may get a vascular bypass prior to closure. Hypercoag panel has been sent, AT3 level normal and remainder is pending. Will consult  hematology today. Independent critical care time is 34 minutes.   Levy Pupaobert Byrum, MD, PhD 02/18/2016, 11:23 AM [facility] [phone] or if no answer [phone]

## 2016-02-01 NOTE — Progress Notes (Signed)
Patient back from OR. RT will continue to monitor. 

## 2016-02-01 NOTE — Progress Notes (Signed)
2 Days Post-Op  Subjective: Stable on vent   Objective: Vital signs in last 24 hours: Temp:  [98.2 F (36.8 C)-99.8 F (37.7 C)] 99.3 F (37.4 C) (06/07 0417) Pulse Rate:  [70-105] 90 (06/07 0700) Resp:  [12-21] 14 (06/07 0700) BP: (100-138)/(57-89) 100/57 mmHg (06/07 0600) SpO2:  [96 %-99 %] 98 % (06/07 0700) Arterial Line BP: (96-134)/(45-62) 96/45 mmHg (06/07 0700) FiO2 (%):  [50 %] 50 % (06/07 0400) Weight:  [76.1 kg (167 lb 12.3 oz)] 76.1 kg (167 lb 12.3 oz) (06/07 0500) Last BM Date:  (PTA)  Intake/Output from previous day: 06/06 0701 - 06/07 0700 In: 3886.2 [I.V.:3566.2; NG/GT:90; IV Piggyback:150] Out: 2100 [Urine:1300; Emesis/NG output:150; Drains:650] Intake/Output this shift: Total I/O In: 30 [NG/GT:30] Out: 130 [Urine:30; Emesis/NG output:50; Drains:50]  Incision/Wound:vac in place   Lab Results:   Recent Labs  01/31/16 1455 02/03/2016 0400  WBC 14.8* 17.4*  HGB 11.0* 10.7*  HCT 32.7* 31.7*  PLT 196 179   BMET  Recent Labs  01/31/16 0505 02/24/2016 0400  NA 134* 135  K 4.2 4.1  CL 103 103  CO2 22 24  GLUCOSE 138* 144*  BUN 17 10  CREATININE 0.82 0.63  CALCIUM 8.1* 8.1*   PT/INR  Recent Labs  01/31/16 0505 02/20/2016 0400  LABPROT 15.8* 16.2*  INR 1.25 1.29   ABG  Recent Labs  01/31/16 0230 02/07/2016 0420  PHART 7.361 7.316*  HCO3 20.7 23.4    Studies/Results: Portable Chest Xray  01/31/2016  CLINICAL DATA:  60 year old male with acute respiratory failure and hypoxia. Status post exploratory laparotomy. EXAM: PORTABLE CHEST 1 VIEW COMPARISON:  None FINDINGS: Endotracheal tube with tip approximately 2.3 cm above the carina. Recommend retraction by approximately 3- 4 cm for optimal positioning. An enteric tube is partially visualized coursing to the left in the abdomen. There is a right IJ central venous line with tip over central SVC. There is atelectatic changes at the left lung base. No focal consolidation. No significant pleural  effusion. No pneumothorax. Top-normal cardiac size. No acute osseous pathology. IMPRESSION: Endotracheal tube above the carina. Left lung base atelectatic changes. No focal consolidation. No pneumothorax. Electronically Signed   By: Elgie CollardArash  Radparvar M.D.   On: 01/31/2016 03:29    Anti-infectives: Anti-infectives    Start     Dose/Rate Route Frequency Ordered Stop   01/31/16 0400  ertapenem (INVANZ) 1 g in sodium chloride 0.9 % 50 mL IVPB     1 g 100 mL/hr over 30 Minutes Intravenous Every 24 hours 01/31/16 0334     01/31/16 0100  [MAR Hold]  cefOXitin (MEFOXIN) 2 g in dextrose 5 % 50 mL IVPB     (MAR Hold since 01/31/16 0049)   2 g 100 mL/hr over 30 Minutes Intravenous To Surgery 01/31/16 0046 01/31/16 0118      Assessment/Plan: s/p Procedure(s): SMALL BOWEL RESECTION (N/A) EXPLORATORY LAPAROTOMY (N/A) Second look today with possible bypass per vascular surgery Stable overall  Operative risks include bleeding,  Infection,  Organ injury,  Nerve injury,  Blood vessel injury,  DVT,  Pulmonary embolism,  Death,  And possible reoperation.  Medical management risks include worsening of present situation.  The success of the procedure is 50 -90 % at treating patients symptoms.  The patient understands and agrees to proceed.  LOS: 2 days    Cyanna Neace A. 01/29/2016

## 2016-02-01 NOTE — Anesthesia Preprocedure Evaluation (Addendum)
Anesthesia Evaluation  Patient identified by MRN, date of birth, ID band Patient unresponsive    Reviewed: Allergy & Precautions, NPO status , Patient's Chart, lab work & pertinent test results  History of Anesthesia Complications Negative for: history of anesthetic complications  Airway Mallampati: Intubated       Dental   Pulmonary Current Smoker,  Remains intubated: mesenteric ischemia, wound vac with open abdomen   breath sounds clear to auscultation       Cardiovascular hypertension, Pt. on medications + Peripheral Vascular Disease (mesenteric ischemia)   Rhythm:Regular Rate:Normal  01/27/16 ECHO: normal LVF, EF 50-55%, valves OK   Neuro/Psych Intubated and sedated TIACVA, Residual Symptoms    GI/Hepatic Neg liver ROS, GERD  Medicated,Mesenteric ischemia, now with wound vac s/p bowel resection   Endo/Other  negative endocrine ROS  Renal/GU Renal infarct     Musculoskeletal   Abdominal   Peds  Hematology  (+) Blood dyscrasia (Hb 10.7), ,   Anesthesia Other Findings   Reproductive/Obstetrics                          Anesthesia Physical Anesthesia Plan  ASA: IV  Anesthesia Plan: General   Post-op Pain Management:    Induction: Intravenous and Inhalational  Airway Management Planned: Oral ETT  Additional Equipment:   Intra-op Plan:   Post-operative Plan: Post-operative intubation/ventilation  Informed Consent: I have reviewed the patients History and Physical, chart, labs and discussed the procedure including the risks, benefits and alternatives for the proposed anesthesia with the patient or authorized representative who has indicated his/her understanding and acceptance.   Consent reviewed with POA  Plan Discussed with: Surgeon and CRNA  Anesthesia Plan Comments: (Plan routine monitors, GETA with existing ETT, post op ventilation Discussed with wife by telephone)         Anesthesia Quick Evaluation

## 2016-02-02 ENCOUNTER — Inpatient Hospital Stay (HOSPITAL_COMMUNITY): Payer: BLUE CROSS/BLUE SHIELD

## 2016-02-02 ENCOUNTER — Encounter (HOSPITAL_COMMUNITY): Payer: Self-pay | Admitting: Vascular Surgery

## 2016-02-02 DIAGNOSIS — I63343 Cerebral infarction due to thrombosis of bilateral cerebellar arteries: Secondary | ICD-10-CM

## 2016-02-02 DIAGNOSIS — I7389 Other specified peripheral vascular diseases: Secondary | ICD-10-CM

## 2016-02-02 DIAGNOSIS — J9601 Acute respiratory failure with hypoxia: Secondary | ICD-10-CM | POA: Insufficient documentation

## 2016-02-02 LAB — CARDIOLIPIN ANTIBODIES, IGG, IGM, IGA

## 2016-02-02 LAB — CBC
HCT: 33.5 % — ABNORMAL LOW (ref 39.0–52.0)
Hemoglobin: 11.1 g/dL — ABNORMAL LOW (ref 13.0–17.0)
MCH: 29.3 pg (ref 26.0–34.0)
MCHC: 33.1 g/dL (ref 30.0–36.0)
MCV: 88.4 fL (ref 78.0–100.0)
PLATELETS: 145 10*3/uL — AB (ref 150–400)
RBC: 3.79 MIL/uL — ABNORMAL LOW (ref 4.22–5.81)
RDW: 14.8 % (ref 11.5–15.5)
WBC: 21.1 10*3/uL — AB (ref 4.0–10.5)

## 2016-02-02 LAB — MAGNESIUM: Magnesium: 1.8 mg/dL (ref 1.7–2.4)

## 2016-02-02 LAB — PROTIME-INR
INR: 1.43 (ref 0.00–1.49)
Prothrombin Time: 17.6 seconds — ABNORMAL HIGH (ref 11.6–15.2)

## 2016-02-02 LAB — LUPUS ANTICOAGULANT PANEL
DRVVT: 48.9 s — AB (ref 0.0–47.0)
PTT Lupus Anticoagulant: 55.6 s — ABNORMAL HIGH (ref 0.0–43.6)

## 2016-02-02 LAB — PTT-LA MIX: PTT-LA Mix: 54.1 s — ABNORMAL HIGH (ref 0.0–40.6)

## 2016-02-02 LAB — BASIC METABOLIC PANEL
Anion gap: 4 — ABNORMAL LOW (ref 5–15)
BUN: 11 mg/dL (ref 6–20)
CALCIUM: 6.8 mg/dL — AB (ref 8.9–10.3)
CO2: 21 mmol/L — ABNORMAL LOW (ref 22–32)
CREATININE: 0.75 mg/dL (ref 0.61–1.24)
Chloride: 104 mmol/L (ref 101–111)
GFR calc Af Amer: >60 mL/min (ref >60)
GLUCOSE: 168 mg/dL — AB (ref 65–99)
Potassium: 4.4 mmol/L (ref 3.5–5.1)
Sodium: 129 mmol/L — ABNORMAL LOW (ref 135–145)

## 2016-02-02 LAB — PHOSPHORUS: Phosphorus: 2.5 mg/dL (ref 2.5–4.6)

## 2016-02-02 LAB — DRVVT MIX: dRVVT Mix: 44.9 s (ref 0.0–47.0)

## 2016-02-02 LAB — HEXAGONAL PHASE PHOSPHOLIPID: Hexagonal Phase Phospholipid: 7 s (ref 0–11)

## 2016-02-02 LAB — BETA-2-GLYCOPROTEIN I ABS, IGG/M/A: Beta-2 Glyco I IgG: <9 GPI IgG units (ref 0–20)

## 2016-02-02 LAB — APTT: aPTT: 35 seconds (ref 24–37)

## 2016-02-02 MED ORDER — SODIUM CHLORIDE 0.9 % IV BOLUS (SEPSIS)
1000.0000 mL | Freq: Once | INTRAVENOUS | Status: AC
  Administered 2016-02-02: 1000 mL via INTRAVENOUS

## 2016-02-02 MED ORDER — KCL IN DEXTROSE-NACL 20-5-0.9 MEQ/L-%-% IV SOLN
INTRAVENOUS | Status: DC
  Administered 2016-02-02: 125 mL/h via INTRAVENOUS
  Administered 2016-02-02: 11:00:00 via INTRAVENOUS
  Administered 2016-02-03 – 2016-02-05 (×4): 125 mL/h via INTRAVENOUS
  Filled 2016-02-02 (×14): qty 1000

## 2016-02-02 MED ORDER — SODIUM CHLORIDE 0.9 % IV SOLN
2.0000 g | Freq: Once | INTRAVENOUS | Status: AC
  Administered 2016-02-02: 2 g via INTRAVENOUS
  Filled 2016-02-02: qty 20

## 2016-02-02 MED ORDER — LACTATED RINGERS IV SOLN
INTRAVENOUS | Status: DC
  Administered 2016-02-02: 08:00:00 via INTRAVENOUS

## 2016-02-02 NOTE — Progress Notes (Signed)
Pharmacy Antibiotic Note  Brian BillMichael E Baxter is a 60 y.o. male admitted on 02/13/2016 with peritonitis.  Pharmacy has been consulted for ertapenem dosing. Tmax is 100.5 and WBC is 21.1. Scr stable at 0.75. MRSA PCR negative and no other cultures done.   Plan: Continue Ertapenem 1g IV Q24H F/u renal fxn, C&S, clinical status *Pharmacy will sign-off and follow peripherally as no dose adjustments are anticipated. Thank you for the consult!  Height: 5\' 9"  (175.3 cm) Weight: 167 lb 12.3 oz (76.1 kg) IBW/kg (Calculated) : 70.7  Temp (24hrs), Avg:99 F (37.2 C), Min:98.1 F (36.7 C), Max:99.7 F (37.6 C)   Recent Labs Lab 02/17/2016 2207 01/31/16 0230 01/31/16 0505 01/31/16 1215 01/31/16 1455 2016-03-05 0400 02/02/16 0338  WBC 18.3* 17.0* 16.4* 13.5* 14.8* 17.4* 21.1*  CREATININE 0.86 0.97 0.82  --   --  0.63 0.75  LATICACIDVEN 0.8 0.9  --   --   --   --   --     Estimated Creatinine Clearance: 99.4 mL/min (by C-G formula based on Cr of 0.75).    Thank you for allowing pharmacy to be a part of this patient's care.  Lysle Pearlachel Jovana Rembold, PharmD, BCPS Pager # [phone] 02/02/2016 11:52 AM

## 2016-02-02 NOTE — Progress Notes (Signed)
PULMONARY / CRITICAL CARE MEDICINE   Name: Brian Baxter MRN: [id number] DOB: May 17, 1956    ADMISSION DATE:  02/21/2016 CONSULTATION DATE:  01/31/2016  REFERRING MD:  Dr. Arbie Cookey  CHIEF COMPLAINT:  Abdominal pain  HISTORY OF PRESENT ILLNESS:   60 year old male with a past medical history significant for hypertension and tobacco abuse was admitted to [facility] on 01/29/2016 in the setting of small bowel ischemia and possible right kidney ischemia. He has been admitted to an outside hospital on 01/25/2016 in the setting of visual changes. He was noted on MRI brain to have multiple strokes, one in the right occipital lobe which was the largest. During that hospitalization at the outside hospital he underwent an extensive workup to look for an etiology of embolic clot which was felt to be the cause of his strokes. He had a TTE with contrast which initially was suggestive of a patent foramen ovale but a follow-up TEE on the following day with contrast showed no evidence of right to left shunt. Both studies showed no evidence of vegetation on the valve. He had an MRI of his abdomen which showed that the right iliac vein was slightly compressed by the overlying iliac artery there was no evidence of venous clots. He also had a lower extremity Doppler ultrasound which was negative. He was discharged on a statin and aspirin instructions to quit smoking.  On 01/29/2016 he developed severe abdominal pain and went to another outside hospital and then was transferred to [place] for further evaluation. He was initially accepted by [place] for consideration of TPA infusion by our interventional radiology group but considering his recent strokes vascular and general surgery were consulted for further evaluation.  He was taken emergently to the operating room on 02/14/2016 where he was found to have ischemic bowel near the ileum. Bowel was resected and no re-anastomosis attempts were made, he was left open with a  wound VAC in place. He was transferred to the intensive care unit intubated on a low-dose of Neo-Synephrine.    SUBJECTIVE:  Sedated on vent Note plans for return to OR Friday (6/9)  VITAL SIGNS: BP 155/78 mmHg  Pulse 100  Temp(Src) 99.3 F (37.4 C) (Axillary)  Resp 20  Ht 5\' 9"  (1.753 m)  Wt 167 lb 12.3 oz (76.1 kg)  BMI 24.76 kg/m2  SpO2 98%  HEMODYNAMICS:    VENTILATOR SETTINGS: Vent Mode:  [-] CPAP;PSV FiO2 (%):  [40 %-50 %] 40 % Set Rate:  [14 bmp] 14 bmp Vt Set:  [570 mL] 570 mL PEEP:  [5 cmH20] 5 cmH20 Pressure Support:  [5 cmH20] 5 cmH20 Plateau Pressure:  [9 cmH20-20 cmH20] 9 cmH20  INTAKE / OUTPUT: I/O last 3 completed shifts: In: 10852.9 [I.V.:9432.9; Blood:670; NG/GT:250; IV Piggyback:500] Out: 7829 [FAOZH:0865; Emesis/NG output:650; Drains:1400; Blood:1100]  PHYSICAL EXAMINATION: General:  Sedated on vent Neuro:  Sedated, MAE spontaneously HEENT:  Normocephalic/atraumatic, endotracheal tube in place Cardiovascular:  Regular rate and rhythm, no murmurs gallops rubs Lungs:  Coarse to auscultation bilaterally with vent supported breaths Abdomen:  No bowel sounds, abdominal wound VAC in place Musculoskeletal:  Normal bulk and tone Skin:  Large wound VAC in place abdomen  LABS:  BMET  Recent Labs Lab 01/31/16 0505 02/17/2016 0400  02-17-16 1614 02-17-2016 1655 02/02/16 0338  NA 134* 135  < > 135 135 129*  K 4.2 4.1  < > 4.3 4.6 4.4  CL 103 103  --   --   --  104  CO2 22 24  --   --   --  21*  BUN 17 10  --   --   --  11  CREATININE 0.82 0.63  --   --   --  0.75  GLUCOSE 138* 144*  --  140*  --  168*  < > = values in this interval not displayed.  Electrolytes  Recent Labs Lab 01/31/16 0230 01/31/16 0505 02/20/2016 0400 02/02/16 0338  CALCIUM 8.2* 8.1* 8.1* 6.8*  MG 2.0  --  2.1 1.8  PHOS  --  3.0 2.6 2.5    CBC  Recent Labs Lab 01/31/16 1455 02/19/2016 0400  02/08/2016 1614 01/31/2016 1655 02/02/16 0338  WBC 14.8* 17.4*  --   --   --   21.1*  HGB 11.0* 10.7*  < > 8.5* 10.5* 11.1*  HCT 32.7* 31.7*  < > 25.0* 31.0* 33.5*  PLT 196 179  --   --   --  145*  < > = values in this interval not displayed.  Coag's  Recent Labs Lab 01/31/16 0230 01/31/16 0505 02/12/2016 0400 02/02/16 0338  APTT 32  --  39* 35  INR 1.18 1.25 1.29 1.43    Sepsis Markers  Recent Labs Lab 02/04/2016 2207 01/31/16 0230  LATICACIDVEN 0.8 0.9    ABG  Recent Labs Lab 02/15/2016 0420 02/24/2016 1458 01/26/2016 1655  PHART 7.316* 7.350 7.342*  PCO2ART 47.2* 39.0 40.3  PO2ART 121* 275.0* 285.0*    Liver Enzymes  Recent Labs Lab 02/12/2016 2207 01/31/16 0505  AST 28 25  ALT 34 32  ALKPHOS 70 59  BILITOT 0.9 0.5  ALBUMIN 2.9* 2.5*    Cardiac Enzymes No results for input(s): TROPONINI, PROBNP in the last 168 hours.  Glucose No results for input(s): GLUCAP in the last 168 hours.  Imaging Dg Chest Port 1 View  02/02/2016  CLINICAL DATA:  Respiratory failure EXAM: PORTABLE CHEST 1 VIEW COMPARISON:  02/04/2016 FINDINGS: There is an ET tube with tip above the carina. There is a right IJ catheter tip in the projection of the SVC. Moderate cardiac enlargement identified. Aortic atherosclerosis noted. There is a moderate left pleural effusion. Diminished aeration to the right midlung and left base is similar to previous exam. Fluid along the minor fissure is identified within the right midlung. IMPRESSION: 1. Persistent decreased aeration to the left midlung and left base. 2. Bilateral pleural effusions left greater than right. Electronically Signed   By: Signa Kellaylor  Stroud M.D.   On: 02/02/2016 08:23     STUDIES:  01/26/2016 outside hospital TTE: Left atrial enlargement, contrast suggestive of patent foramen ovale 01/26/2016 bilateral carotid ultrasound: No significant stenosis 01/26/2016 ultrasound Doppler legs>  negative for  DVT 01/27/2016 MR angiogram pelvis: Mild compression of the left common iliac vein without significant narrowing or  thrombosis 01/27/2016 outside hospital TTE> Normal valves, left atrial enlargement, there is no abnormal contrast to suggest intra-atrial shunting 6/5 CT abdomen OSH> CT angiogram findings worrisome for superior mesenteric artery clot, small bowel ischemia, infarcts to kidney and spleen.  CULTURES: None  ANTIBIOTICS: 01/31/2016 ertapenem  SIGNIFICANT EVENTS: June Fifth 2017 CT angiogram findings worrisome for superior mesenteric artery clot, taken to the operating room where several inches of ileum were resected  LINES/TUBES: 01/28/2016 endotracheal tube  DISCUSSION: This is a 60 year old male who has small bowel ischemia, possible renal infarct in the setting of the superior mesenteric artery clot. This occurs several days after recent hospitalization for stroke and what was felt  to be an embolic cause of his stroke. The etiology of his underlying hypercoagulable state is uncertain, is still uncertain if there is one common clot leading to embolic phenomena but the extensive workup performed at the outside hospital was suggest otherwise. At this time he remains critically ill after having emergent bowel resection and he is sedated on the ventilator with plans to go back to the operating room in 48 hours for second look and possible vascular intervention.  ASSESSMENT / PLAN:  PULMONARY A: Mechanical ventilatory support postoperative History of tobacco use ? Left lower lobe pna P:   PSV 40% 5/5 For return to OR 6/9 .   - Keep sedated on vent for now with open abd.   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 Shock postoperatively, likely sedation related P:  Vascular surgery involved in open abd 6/6; vascular bypass on 6/7  - return to OR on 6/9 for possible closure Telemetry monitoring Hold anticoagulation given recent large ischemic stroke Titrate NEO to maintain mean arterial pressure greater than 65  - consider change to levophed  due to bradycardia Start hypercoagulable workup Continue aspirin if okay by vascular surgery and general surgery Neurology consult  Consider repeat TEE or TTE  RENAL Lab Results  Component Value Date   CREATININE 0.75 02/02/2016   CREATININE 0.63 01/31/2016   CREATININE 0.82 01/31/2016    A:   Concern for partial right renal infarct on CT angiogram, normal renal function P:   Monitor BMET and UOP  - 1 liter NS given this AM for loss with wound vac and NGT  - recheck BMP at 1600 Replace electrolytes as needed Calcium Gluconate 2 grams given this AM   GASTROINTESTINAL A:   Small bowel ischemia secondary to superior mesenteric artery clot Post laparotomy with open abd P:   Postoperative care for general surgery Nothing by mouth PPI  HEMATOLOGIC A:   Concern for hypercoagulable state Stents of the workup for embolic cause of multiple arterial strokes negative at outside hospital 3-4 days prior to admission P:  Hypercoagulability panel > AT III level normal, rest is pending Consider hematology consult depending on hypercoag results Hold heparin until okay by neurology, presumably June 13 (14 days post stroke)  INFECTIOUS A:   Peritonitis P:   Ertapenem --> 6/6  ENDOCRINE A:   No acute  issues P:   Monitor glucose  NEUROLOGIC A:   Recent stroke P:   RASS goal: -3 Heavy sedation while abdomen open Fentanyl gtt Versed prn   FAMILY  - Updates: none bedside on my exam  - Inter-disciplinary family meet or Palliative Care meeting due by:  day 7  Jacqulyn Cane FNP, ACNP-S Adolph Pollack PCCM 02/02/2016, 11:07 AM   Attending Note:  I have examined patient, reviewed labs, studies and notes. I have discussed the case with S Kell NP, and I agree with the data and plans as amended above. 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 On my eval he is hemodynamically stable without  pressors, is requiring Fio2 0.40 + PEEP 5. He wakes to voice, then quickly back to sleep. We will plan to continue MV pending trip back to OR tomorrow. We will give NS bolus to account for insensible losses adn wound vac losses. Hypercoag panel has been sent, most is pending. Appreciate Dr Maxine Glenn assistance.  Independent critical care time is 34 minutes.  Levy Pupa, MD, PhD 02/02/2016, 11:07 AM [facility] [phone] or if no answer [phone]

## 2016-02-02 NOTE — Progress Notes (Addendum)
[facility]  Subjective  - Intubated not opening eyes, moving right foot.   Objective 128/76 91 98.4 F (36.9 C) (Oral) 14 98%  Intake/Output Summary (Last 24 hours) at 02/02/16 0742 Last data filed at 02/02/16 0600  Gross per 24 hour  Intake 8779.88 ml  Output   3620 ml  Net 5159.88 ml    Doppler 3+ right DP/PT, left 2+ dp/pt left foot cool to touch no ischemic changes Abdominal wound vac in place 1,100 cc out put Urine > 300 cc per shift out put   Assessment/Planning: POD # 1 Second look exploratory laparotomy followed by infrarenal aorta to superior mesenteric artery bypass with saphenous vein and superior mesenteric artery thrombectomy  Procedure: Exploratory laparotomy with small bowel resection and placement of abdominal vacuum pack dressing  Surgeon: Harriette Bouillonhomas Cornett M.D. Specimens: Approximately 2 feet of ileum to pathology  Plan for return to OR Tomorrow bowel exploration and possible primary abdominal closure  Brian Baxter, Brian Baxter 02/02/2016 7:42 AM --  Laboratory Lab Results:  Recent Labs  02/13/2016 0400  02/19/2016 1655 02/02/16 0338  WBC 17.4*  --   --  21.1*  HGB 10.7*  < > 10.5* 11.1*  HCT 31.7*  < > 31.0* 33.5*  PLT 179  --   --  145*  < > = values in this interval not displayed. BMET  Recent Labs  02/17/2016 0400  02/24/2016 1614 02/24/2016 1655 02/02/16 0338  NA 135  < > 135 135 129*  K 4.1  < > 4.3 4.6 4.4  CL 103  --   --   --  104  CO2 24  --   --   --  21*  GLUCOSE 144*  --  140*  --  168*  BUN 10  --   --   --  11  CREATININE 0.63  --   --   --  0.75  CALCIUM 8.1*  --   --   --  6.8*  < > = values in this interval not displayed.  COAG Lab Results  Component Value Date   INR 1.43 02/02/2016   INR 1.29 02/11/2016   INR 1.25 01/31/2016   No results found for: PTT    I have examined the patient, reviewed and agree with above.Remains hemodynamically stable. 2-3+ popliteal pulses bilaterally. Left  foot somewhat cooler than right. Right foot with palpable dorsalis pedis pulse on the left with good signals. Abdominal VAC in place. Sedated on vent. H&H stable. White count continued elevated at 21. Discussed with patient's son and daughter-in-law present. Plan for her turn to the OR tomorrow for relook at the intestines and hopeful closure. I am out of town tomorrow and the weekend. Discussed this with the patient's family. Dr. Myra GianottiBrabham we will follow.  Gretta BeganEarly, Helios Kohlmann, MD 02/02/2016 10:32 AM

## 2016-02-02 NOTE — Progress Notes (Signed)
Patient returned to full support vent settings due to increased WOB, increased RR, and increased BP. RN made aware. RT will continue to monitor.

## 2016-02-02 NOTE — Progress Notes (Signed)
Decreased fentanyl drip to 100 mcg.  Pt's heart rate is a low ST with a BP WNL on 20 mcg of neosynephrine.

## 2016-02-02 NOTE — Progress Notes (Signed)
Pt's sedation was decreased and remained at a low rate for a few hours, however, pt would not follow commands.  Pt does localize pain and does withdraw to pain for each extremity.  However, at approximately 1300, the pt's heart rate rose to the 150s and the systolic BP was greater than 200.  The pt was agitated, however, would not follow commands.  Fentanyl gtt was increased from 75mcg to 200 mcg and 2mg  of versed was administered, as the pt's oxygen saturation level was dropping.  Pt had been placed on full support on the vent prior to the event.

## 2016-02-02 NOTE — Progress Notes (Signed)
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 male is currently sedated and intubated. Family members were not present. I reviewed outside records extensively and summarized as follows This patient has significant history of smoking. It does not seem the patient had much other past medical history apart from hypertension Summary of hematology history as follows: #1 01/23/2016: He saw his primary care doctor complaining of new onset of constipation and abdominal pain. He was started on Protonix #2 01/25/2016 he saw his primary care doctor again for worsening pain, left-sided arm weakness, drowsiness and blurriness of vision. Stroke was suspected and he was sent to the emergency room for further evaluation #3 From 01/25/2016 to 01/28/2016, he was admitted to [facility]. He was diagnosed with stroke with elevated troponin on the background history of hypercholesterolemia and smoking as predominant risk factors. He was placed on Lipitor and aspirin with plan for outpatient follow-up. Summary of all imaging studies/tests as follows: - MRI of the head with and without contrast dated 01/25/2016 show extensive diffuse supra and infra tentorial acute infarcts, suspicious for embolic phenomenon - Ultrasound carotid dated 01/26/2016 show bilateral bifurcation plaque but no significant stenosis - Ultrasound venous Doppler dated 01/26/2016 show no evidence of DVT - Transesophageal echocardiogram dated 01/26/2016 again confirmed normal ejection fraction and no significant valvular heart disease. There is evidence of inter atrial shunts. A patent foramen ovale is  suspected. The left atrium is mildly dilated. - Echocardiogram from 01/27/2016 showed preserved ejection fraction with no significant valvular heart disease. - MR angiogram of the pelvis dated 01/27/16 with and without contrast show mild compression of left common iliac vein but no significant narrowing or resulting thrombosis Throughout his entire hospital stay, there were nothing to suggest evidence of mesenteric thrombosis or worsening constipation/abdominal pain. He was discharged home with plan for outpatient rehabilitation and neurology follow-up #4 the patient developed worsening abdominal pain. CT scan of the abdomen and pelvis with contrast show evidence of emboli within the superior mesenteric artery with possible small bowel ischemia. It is also noted the patient has evidence of possible bilateral kidney and splenic infarct. CT imaging of the time did show evidence of chronic, calcified subtotal occlusion of celiac, SMA and inferior mesenteric arteries. There were evidence of chronic calcification of aortic and bilateral iliac arteries. Thrombolytic therapy was considered but contraindicated due to recent stroke. He was placed on heparin therapy with vascular consultation. Decision was made for the patient to undergo exploration and hopefully bypass surgery. #5 01/31/2016 he underwent exploratory laparotomy, resection of ischemic portion of necrotic distal ileum and open abdomen vacuum assisted closure. #6 01/31/2016, he underwent second look exploratory laparotomy, infrarenal aorta-superior mesenteric artery bypass surgery with saphenous vein and superior mesenteric artery thrombectomy, further ileal resection, and application of abdominal wound VAC  Postoperatively, the patient appears to be hemodynamically least stable. Thrombophilia panel was sent and results are pending  MEDICAL HISTORY:  Past Medical History  Diagnosis Date  . Hypertension   . TIA (transient ischemic attack) june 2017   . 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 CVA (cerebral infarction)   . 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 Cyst excision Left 1987    testicle  . Laparotomy N/A 01/26/2016    Procedure: EXPLORATORY LAPAROTOMY;  Surgeon: Rosetta Posner, MD;  Location: [facility];  Service: Vascular;  Laterality: N/A;  . Bowel resection N/A 01/29/2016    Procedure: SMALL BOWEL RESECTION;  Surgeon: Georganna Skeans, MD;  Location: [facility];  Service: General;  Laterality: N/A;    SOCIAL HISTORY: Social History   Social History  . Marital Status: Married    Spouse Name: N/A  . Number of Children: N/A  . Years of Education: N/A   Occupational History  . Not on file.   Social History Main Topics  . Smoking status: Current Every Day Smoker -- 1.00 packs/day for 30 years    Types: Cigarettes  . Smokeless tobacco: Never Used  . Alcohol Use: Yes     Comment: monthly  . Drug Use: No  . Sexual Activity: Yes    Birth Control/ Protection: None   Other Topics Concern  . Not on file   Social History Narrative    FAMILY HISTORY: Family History  Problem Relation Age of Onset  . Stroke Father     ALLERGIES:  is allergic to atorvastatin; penicillins; and simvastatin.  MEDICATIONS:  Current Facility-Administered Medications  Medication Dose Route Frequency Provider Last Rate Last Dose  . 0.9 %  sodium chloride infusion   Intravenous Once Rosetta Posner, MD      . acetaminophen (TYLENOL) tablet 650 mg  650 mg Oral Q6H PRN Toy Baker, MD       Or  . acetaminophen (TYLENOL) suppository 650 mg  650 mg Rectal Q6H PRN Toy Baker, MD      . albuterol (PROVENTIL) (2.5 MG/3ML) 0.083% nebulizer solution 2.5 mg  2.5 mg Nebulization Q2H PRN Juanito Doom, MD      . alum & mag hydroxide-simeth (MAALOX/MYLANTA) 200-200-20 MG/5ML suspension 15-30 mL  15-30 mL Oral Q2H PRN Ulyses Amor, PA-C      . antiseptic oral rinse  solution (CORINZ)  7 mL Mouth Rinse QID Juanito Doom, MD   7 mL at 02/02/16 0400  . aspirin suppository 300 mg  300 mg Rectal Daily Toy Baker, MD   300 mg at 02/16/2016 1149  . chlorhexidine gluconate (SAGE KIT) (PERIDEX) 0.12 % solution 15 mL  15 mL Mouth Rinse BID Juanito Doom, MD   15 mL at 01/29/2016 1949  . dextrose 5 % and 0.45 % NaCl with KCl 20 mEq/L infusion   Intravenous Continuous Ulyses Amor, PA-C 125 mL/hr at 02/02/16 0030    . docusate sodium (COLACE) capsule 100 mg  100 mg Oral Daily Ulyses Amor, PA-C   100 mg at 02/17/2016 1000  . ertapenem (INVANZ) 1 g in sodium chloride 0.9 % 50 mL IVPB  1 g Intravenous Q24H Veronda P Bryk, RPH   1 g at 02/02/16 0415  . famotidine (PEPCID) IVPB 20 mg premix  20 mg Intravenous Q12H Juanito Doom, MD   20 mg at 02/16/2016 2116  . fentaNYL (SUBLIMAZE) 2,500 mcg in sodium chloride 0.9 % 250 mL (10 mcg/mL) infusion  25-400 mcg/hr Intravenous Continuous Juanito Doom, MD 35 mL/hr at 02/18/2016 2330 350 mcg/hr at 02/11/2016 2330  . fentaNYL (SUBLIMAZE) bolus via infusion 50 mcg  50 mcg Intravenous Q1H PRN Juanito Doom, MD   50 mcg at 01/31/16 1351  . guaiFENesin-dextromethorphan (ROBITUSSIN DM) 100-10 MG/5ML syrup 15 mL  15 mL  Oral Q4H PRN Ulyses Amor, PA-C      . hydrALAZINE (APRESOLINE) injection 5 mg  5 mg Intravenous Q20 Min PRN Ulyses Amor, PA-C      . HYDROmorphone (DILAUDID) injection 0.5 mg  0.5 mg Intravenous Q3H PRN Toy Baker, MD      . labetalol (NORMODYNE,TRANDATE) injection 10 mg  10 mg Intravenous Q10 min PRN Ulyses Amor, PA-C      . magnesium sulfate IVPB 2 g 50 mL  2 g Intravenous Daily PRN Ulyses Amor, PA-C      . metoprolol (LOPRESSOR) injection 2-5 mg  2-5 mg Intravenous Q2H PRN Ulyses Amor, PA-C      . midazolam (VERSED) injection 2 mg  2 mg Intravenous Q2H PRN Juanito Doom, MD   2 mg at 02/04/2016 2309  . ondansetron (ZOFRAN) tablet 4 mg  4 mg Oral Q6H PRN Toy Baker, MD        Or  . ondansetron (ZOFRAN) injection 4 mg  4 mg Intravenous Q6H PRN Toy Baker, MD      . pantoprazole (PROTONIX) EC tablet 40 mg  40 mg Oral Daily Ulyses Amor, PA-C   40 mg at 01/31/16 1000  . phenol (CHLORASEPTIC) mouth spray 1 spray  1 spray Mouth/Throat PRN Ulyses Amor, PA-C      . phenylephrine (NEO-SYNEPHRINE) 10 mg in dextrose 5 % 250 mL (0.04 mg/mL) infusion  30-200 mcg/min Intravenous Continuous Raylene Miyamoto, MD 52.5 mL/hr at 02/02/16 0600 35 mcg/min at 02/02/16 0600  . potassium chloride SA (K-DUR,KLOR-CON) CR tablet 20-40 mEq  20-40 mEq Oral Daily PRN Ulyses Amor, PA-C      . sodium chloride flush (NS) 0.9 % injection 3 mL  3 mL Intravenous Q12H Toy Baker, MD   3 mL at 01/31/16 2200    REVIEW OF SYSTEMS:  Unable to assess as the patient is intubated and sedated with no family present  PHYSICAL EXAMINATION: ECOG PERFORMANCE STATUS: 4 - Bedbound  Filed Vitals:   02/02/16 0645 02/02/16 0700  BP:  128/76  Pulse: 83 91  Temp:    Resp: 14 14   Filed Weights   01/31/2016 2100 01/31/16 0545 02/08/2016 0500  Weight: 164 lb 3.9 oz (74.5 kg) 171 lb 4.8 oz (77.7 kg) 167 lb 12.3 oz (76.1 kg)    GENERAL:The patient is sedated and intubated SKIN: skin color, texture, turgor are normal, no rashes or significant lesions EYES: Unable to assess, closed OROPHARYNX: ET tube present NECK: supple, thyroid normal size, non-tender, without nodularity LYMPH:  no palpable lymphadenopathy in the cervical, axillary or inguinal LUNGS: clear to auscultation and percussion with normal breathing effort HEART: regular rate & rhythm and no murmurs and no lower extremity edema ABDOMEN:abdomen soft with presence of wound VAC Musculoskeletal:no cyanosis of digits and no clubbing  PSYCH: Unable to assess NEURO: He appears to be moving all 4 limbs spontaneously  LABORATORY DATA:  I have reviewed the data as listed Lab Results  Component Value Date   WBC 21.1* 02/02/2016    HGB 11.1* 02/02/2016   HCT 33.5* 02/02/2016   MCV 88.4 02/02/2016   PLT 145* 02/02/2016   Lab Results  Component Value Date   CREATININE 0.75 02/02/2016   CREATININE 0.63 02/05/2016   CREATININE 0.82 01/31/2016   RADIOGRAPHIC STUDIES: I have reviewed multiple CT imaging myself I have personally reviewed the radiological images as listed and agreed with the findings in the report. Dg Chest  Port 1 View  02/02/2016  CLINICAL DATA:  Abdominal surgery, endotracheal tube placement EXAM: PORTABLE CHEST 1 VIEW COMPARISON:  Portable chest x-ray of 01/31/2016 and 02/07/2016, CT chest of 01/24/2006 FINDINGS: There has been and increase in bibasilar opacities left-greater-than-right most consistent with atelectasis and possible effusion on the left. Pneumonia particularly the left lung base cannot be excluded. The tip of the endotracheal tube is approximately 4.3 cm above the carina. Right IJ central venous line tip overlies the mid SVC and the NG tube extends below the hemidiaphragm. IMPRESSION: 1. Increasing basilar opacities left-greater-than-right most consistent with atelectasis and possible left effusion. Cannot exclude pneumonia at the left lung base. 2. Tip of endotracheal tube 4.3 cm above the carina. Electronically Signed   By: Ivar Drape M.D.   On: 02/15/2016 08:21   Portable Chest Xray  01/31/2016  CLINICAL DATA:  60 year old male with acute respiratory failure and hypoxia. Status post exploratory laparotomy. EXAM: PORTABLE CHEST 1 VIEW COMPARISON:  None FINDINGS: Endotracheal tube with tip approximately 2.3 cm above the carina. Recommend retraction by approximately 3- 4 cm for optimal positioning. An enteric tube is partially visualized coursing to the left in the abdomen. There is a right IJ central venous line with tip over central SVC. There is atelectatic changes at the left lung base. No focal consolidation. No significant pleural effusion. No pneumothorax. Top-normal cardiac size. No acute  osseous pathology. IMPRESSION: Endotracheal tube above the carina. Left lung base atelectatic changes. No focal consolidation. No pneumothorax. Electronically Signed   By: Anner Crete M.D.   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 His overall presentation is highly suspicious for embolic phenomenon. From the cardiac structural standpoint, there were no evidence of significant valvular heart disease. Patent foreman ovale is noted. There were no evidence of blood clot present in his heart and from outside tranthoracic echocardiogram and transesophageal echocardiogram. CT scan showed evidence of chronic calcification of multiple blood vessels but the patient presented with evidence of mesenteric ischemia/angina prior to hospitalization. He was on aspirin prior to admission. There were no signs of arrhythmia or atrial fibrillation as a cause of embolic phenomenon. There were no evidence of venous thrombosis from the lower extremities that could cause embolic phenomenon through patent foramen ovale  Definitely, I think it is appropriate to look for thrombophilia disorder although most inheritable thrombophilia disorder  such as factor V Leiden disorder generally are NOT the cause of significant, diffuse arterial type embolic phenomenon causing multi-organ thrombotic complication at the same time. TTP might be a possibility given his evidence of outside presentation with altered mental status although that could be explained by acute stroke. There were no evidence of renal dysfunction or thrombocytopenia. Another possibility would be whether the patient has undiagnosed lupus anticoagulant (outside ANA testing was negative), rare myeloproliferative disorder, PNH or hyperhomocystinemia.  For now, the  patient remained critically ill in the ICU. He just completed multiple surgeries. The risk of anticoagulation therapy has to be balanced with the risk of bleeding. I recommend at a very minimum level, the patient should be on aspirin and DVT prophylaxis with either subcutaneous heparin or Lovenox, if not contraindicated by the surgeons. Once the patient is more stable, he can be transitioned to oral anticoagulation therapy and by then we should have more test results available  All the tests mentioned above for take several days for results to come back. I will return tomorrow to see the patient.    [place], [place], MD 02/02/2016 7:14 AM

## 2016-02-02 NOTE — Progress Notes (Signed)
1 Day Post-Op  Subjective: Sedated on the Vent and goes between hypotensive with rest and hypertensive with stimulation.  He is losing a good deal of fluid from the wound vac.  On Neo for his pressure.  He is in sinus tachycardia.  He sounds wet on the Vent, CXR findings below.   .   Objective: Vital signs in last 24 hours: Temp:  [98.1 F (36.7 C)-99.7 F (37.6 C)] 99.3 F (37.4 C) (06/08 0840) Pulse Rate:  [68-117] 100 (06/08 0840) Resp:  [6-25] 20 (06/08 0840) BP: (86-155)/(53-85) 155/78 mmHg (06/08 0840) SpO2:  [94 %-100 %] 98 % (06/08 0700) Arterial Line BP: (77-307)/(35-83) 99/47 mmHg (06/08 0700) FiO2 (%):  [40 %-50 %] 40 % (06/08 0841) Last BM Date:  (PTA) 920 urine NG 500 Drain 1100  Wound vac 1100 Blood loss yesterday.   5 liters + fluid balance recorded Afebrile, tachycardic, SR, VSS, on Vent NA 125 WBC 21.1 H/H is stable INR 1.43 CXR this AM show decreased aeration left base and mid lung, bilateral effusions left greater than the right.   Transfused 4 units in OR Intake/Output from previous day: 06/07 0701 - 06/08 0700 In: 8779.9 [I.V.:7549.9; Blood:670; NG/GT:160; IV Piggyback:400] Out: 5784 [Urine:920; Emesis/NG output:500; Drains:1100; Blood:1100] Intake/Output this shift: Total I/O In: -  Out: 400 [Urine:100; Emesis/NG output:50; Drains:250]  General appearance: sedated on the VENT, not really cognitive response, but his BP goes up with any stimulation. Resp: ronchi both lungs. GI: distended, clear fluid coming from the wound vac.  no BS, wound vac in place.  Lab Results:   Recent Labs  02/19/2016 0400  02/04/2016 1655 02/02/16 0338  WBC 17.4*  --   --  21.1*  HGB 10.7*  < > 10.5* 11.1*  HCT 31.7*  < > 31.0* 33.5*  PLT 179  --   --  145*  < > = values in this interval not displayed.  BMET  Recent Labs  01/31/2016 0400  01/31/2016 1614 02/17/2016 1655 02/02/16 0338  NA 135  < > 135 135 129*  K 4.1  < > 4.3 4.6 4.4  CL 103  --   --   --  104   CO2 24  --   --   --  21*  GLUCOSE 144*  --  140*  --  168*  BUN 10  --   --   --  11  CREATININE 0.63  --   --   --  0.75  CALCIUM 8.1*  --   --   --  6.8*  < > = values in this interval not displayed. PT/INR  Recent Labs  02/03/2016 0400 02/02/16 0338  LABPROT 16.2* 17.6*  INR 1.29 1.43     Recent Labs Lab 01/29/2016 2207 01/31/16 0505  AST 28 25  ALT 34 32  ALKPHOS 70 59  BILITOT 0.9 0.5  PROT 7.3 6.0*  ALBUMIN 2.9* 2.5*     Lipase  No results found for: LIPASE   Studies/Results: Dg Chest Port 1 View  02/02/2016  CLINICAL DATA:  Respiratory failure EXAM: PORTABLE CHEST 1 VIEW COMPARISON:  02/15/2016 FINDINGS: There is an ET tube with tip above the carina. There is a right IJ catheter tip in the projection of the SVC. Moderate cardiac enlargement identified. Aortic atherosclerosis noted. There is a moderate left pleural effusion. Diminished aeration to the right midlung and left base is similar to previous exam. Fluid along the minor fissure is identified within the right midlung.  IMPRESSION: 1. Persistent decreased aeration to the left midlung and left base. 2. Bilateral pleural effusions left greater than right. Electronically Signed   By: Kerby Moors M.D.   On: 02/02/2016 08:23   Dg Chest Port 1 View  02/11/2016  CLINICAL DATA:  Abdominal surgery, endotracheal tube placement EXAM: PORTABLE CHEST 1 VIEW COMPARISON:  Portable chest x-ray of 01/31/2016 and 02/08/2016, CT chest of 01/24/2006 FINDINGS: There has been and increase in bibasilar opacities left-greater-than-right most consistent with atelectasis and possible effusion on the left. Pneumonia particularly the left lung base cannot be excluded. The tip of the endotracheal tube is approximately 4.3 cm above the carina. Right IJ central venous line tip overlies the mid SVC and the NG tube extends below the hemidiaphragm. IMPRESSION: 1. Increasing basilar opacities left-greater-than-right most consistent with atelectasis and  possible left effusion. Cannot exclude pneumonia at the left lung base. 2. Tip of endotracheal tube 4.3 cm above the carina. Electronically Signed   By: Ivar Drape M.D.   On: 01/30/2016 08:21    Medications: . sodium chloride   Intravenous Once  . antiseptic oral rinse  7 mL Mouth Rinse QID  . aspirin  300 mg Rectal Daily  . chlorhexidine gluconate (SAGE KIT)  15 mL Mouth Rinse BID  . docusate sodium  100 mg Oral Daily  . ertapenem  1 g Intravenous Q24H  . famotidine (PEPCID) IV  20 mg Intravenous Q12H  . pantoprazole  40 mg Oral Daily  . sodium chloride flush  3 mL Intravenous Q12H   . dextrose 5 % and 0.45 % NaCl with KCl 20 mEq/L 125 mL/hr at 02/02/16 0921  . fentaNYL infusion INTRAVENOUS 150 mcg/hr (02/02/16 0915)  . lactated ringers 20 mL/hr at 02/02/16 0800  . 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 Lavone Neri Thompson/Dr. Sherren Mocha Early POD2 Exploratory laparotomy with small bowel resection and placement of abdominal vacuum pack dressing, 02/20/2016, Dr. Brantley Stage  POD1 Second look exploratory laparotomy followed by infrarenal aorta to superior mesenteric artery bypass with saphenous vein and superior mesenteric artery thrombectomy, 02/07/2016, Dr. Sherren Mocha Early VDRF post op on the Vent  -  CCM Post op hypotension  - still on phenylephrine  FEN:  IV fluids/hyponatremia ID:  Day 4 Invanz DVT: SCD Dispo:  ICU/Vent/Back to OR in AM   Plan: I will give him some fluid to replace the loss from the abdomen.  Change his main fluids to NS, and continue at current rate, I will let CCM defer to CCM if they want to adjust in some other fashion.  Continue antibiotics.  Surgery tomorrow.  Decide on TNA tomorrow after he gets  some time to recalibrate his NA.       LOS: 3 days    Brian Baxter 02/02/2016 [phone]

## 2016-02-03 ENCOUNTER — Inpatient Hospital Stay (HOSPITAL_COMMUNITY): Payer: BLUE CROSS/BLUE SHIELD | Admitting: Anesthesiology

## 2016-02-03 ENCOUNTER — Encounter (HOSPITAL_COMMUNITY): Admission: AD | Disposition: E | Payer: Self-pay | Source: Other Acute Inpatient Hospital | Attending: Vascular Surgery

## 2016-02-03 ENCOUNTER — Encounter (HOSPITAL_COMMUNITY): Payer: Self-pay | Admitting: Anesthesiology

## 2016-02-03 ENCOUNTER — Inpatient Hospital Stay (HOSPITAL_COMMUNITY): Payer: BLUE CROSS/BLUE SHIELD

## 2016-02-03 DIAGNOSIS — D638 Anemia in other chronic diseases classified elsewhere: Secondary | ICD-10-CM

## 2016-02-03 DIAGNOSIS — J9600 Acute respiratory failure, unspecified whether with hypoxia or hypercapnia: Secondary | ICD-10-CM

## 2016-02-03 DIAGNOSIS — D72829 Elevated white blood cell count, unspecified: Secondary | ICD-10-CM

## 2016-02-03 DIAGNOSIS — D696 Thrombocytopenia, unspecified: Secondary | ICD-10-CM

## 2016-02-03 DIAGNOSIS — J9690 Respiratory failure, unspecified, unspecified whether with hypoxia or hypercapnia: Secondary | ICD-10-CM | POA: Insufficient documentation

## 2016-02-03 HISTORY — PX: LAPAROTOMY: SHX154

## 2016-02-03 HISTORY — PX: BOWEL RESECTION: SHX1257

## 2016-02-03 LAB — TYPE AND SCREEN
ABO/RH(D): A POS
Antibody Screen: NEGATIVE
UNIT DIVISION: 0
UNIT DIVISION: 0
Unit division: 0
Unit division: 0

## 2016-02-03 LAB — RENAL FUNCTION PANEL
ALBUMIN: 1.3 g/dL — AB (ref 3.5–5.0)
ALBUMIN: 1.2 g/dL — AB (ref 3.5–5.0)
ANION GAP: 4 — AB (ref 5–15)
Albumin: 1.2 g/dL — ABNORMAL LOW (ref 3.5–5.0)
Anion gap: 5 (ref 5–15)
Anion gap: 4 — ABNORMAL LOW (ref 5–15)
BUN: 9 mg/dL (ref 6–20)
BUN: 10 mg/dL (ref 6–20)
BUN: 10 mg/dL (ref 6–20)
CALCIUM: 7.2 mg/dL — AB (ref 8.9–10.3)
CALCIUM: 7.3 mg/dL — AB (ref 8.9–10.3)
CHLORIDE: 109 mmol/L (ref 101–111)
CO2: 20 mmol/L — ABNORMAL LOW (ref 22–32)
CO2: 20 mmol/L — ABNORMAL LOW (ref 22–32)
CO2: 20 mmol/L — ABNORMAL LOW (ref 22–32)
CREATININE: 0.72 mg/dL (ref 0.61–1.24)
CREATININE: 0.71 mg/dL (ref 0.61–1.24)
Calcium: 7.3 mg/dL — ABNORMAL LOW (ref 8.9–10.3)
Chloride: 108 mmol/L (ref 101–111)
Chloride: 108 mmol/L (ref 101–111)
Creatinine, Ser: 0.71 mg/dL (ref 0.61–1.24)
GFR calc Af Amer: >60 mL/min (ref >60)
GFR calc non Af Amer: >60 mL/min (ref >60)
GLUCOSE: 157 mg/dL — AB (ref 65–99)
Glucose, Bld: 151 mg/dL — ABNORMAL HIGH (ref 65–99)
Glucose, Bld: 147 mg/dL — ABNORMAL HIGH (ref 65–99)
PHOSPHORUS: 2.5 mg/dL (ref 2.5–4.6)
PHOSPHORUS: 2.6 mg/dL (ref 2.5–4.6)
Phosphorus: 3 mg/dL (ref 2.5–4.6)
Potassium: 4.4 mmol/L (ref 3.5–5.1)
Potassium: 4.4 mmol/L (ref 3.5–5.1)
Potassium: 4.5 mmol/L (ref 3.5–5.1)
SODIUM: 132 mmol/L — AB (ref 135–145)
SODIUM: 133 mmol/L — AB (ref 135–145)
Sodium: 133 mmol/L — ABNORMAL LOW (ref 135–145)

## 2016-02-03 LAB — POCT I-STAT 3, ART BLOOD GAS (G3+)
Acid-base deficit: 6 mmol/L — ABNORMAL HIGH (ref 0.0–2.0)
Bicarbonate: 18.5 mEq/L — ABNORMAL LOW (ref 20.0–24.0)
O2 Saturation: 95 %
TCO2: 19 mmol/L (ref 0–100)
pCO2 arterial: 34.7 mmHg — ABNORMAL LOW (ref 35.0–45.0)
pH, Arterial: 7.336 — ABNORMAL LOW (ref 7.350–7.450)
pO2, Arterial: 83 mmHg (ref 80.0–100.0)

## 2016-02-03 LAB — CBC
HEMATOCRIT: 30.2 % — AB (ref 39.0–52.0)
HEMOGLOBIN: 10.3 g/dL — AB (ref 13.0–17.0)
MCH: 30.4 pg (ref 26.0–34.0)
MCHC: 34.1 g/dL (ref 30.0–36.0)
MCV: 89.1 fL (ref 78.0–100.0)
PLATELETS: 89 10*3/uL — AB (ref 150–400)
RBC: 3.39 MIL/uL — AB (ref 4.22–5.81)
RDW: 14.7 % (ref 11.5–15.5)
WBC: 23.9 10*3/uL — ABNORMAL HIGH (ref 4.0–10.5)

## 2016-02-03 LAB — BLOOD GAS, ARTERIAL
ACID-BASE DEFICIT: 5.7 mmol/L — AB (ref 0.0–2.0)
BICARBONATE: 18.7 meq/L — AB (ref 20.0–24.0)
DRAWN BY: 364961
FIO2: 0.4
O2 Saturation: 94.8 %
PEEP/CPAP: 5 cmH2O
Patient temperature: 99.7
RATE: 14 resp/min
TCO2: 19.7 mmol/L (ref 0–100)
VT: 570 mL
pCO2 arterial: 34.5 mmHg — ABNORMAL LOW (ref 35.0–45.0)
pH, Arterial: 7.357 (ref 7.350–7.450)
pO2, Arterial: 75.9 mmHg — ABNORMAL LOW (ref 80.0–100.0)

## 2016-02-03 LAB — GLUCOSE, CAPILLARY: Glucose-Capillary: 139 mg/dL — ABNORMAL HIGH (ref 65–99)

## 2016-02-03 LAB — PROTHROMBIN GENE MUTATION

## 2016-02-03 LAB — HOMOCYSTEINE: Homocysteine: 4.4 umol/L (ref 0.0–15.0)

## 2016-02-03 LAB — PREPARE RBC (CROSSMATCH)

## 2016-02-03 SURGERY — LAPAROTOMY, EXPLORATORY
Anesthesia: General | Site: Abdomen

## 2016-02-03 MED ORDER — ROCURONIUM BROMIDE 100 MG/10ML IV SOLN
INTRAVENOUS | Status: DC | PRN
  Administered 2016-02-03: 50 mg via INTRAVENOUS
  Administered 2016-02-03: 20 mg via INTRAVENOUS
  Administered 2016-02-03: 30 mg via INTRAVENOUS

## 2016-02-03 MED ORDER — PROPOFOL 10 MG/ML IV BOLUS
INTRAVENOUS | Status: AC
  Filled 2016-02-03: qty 20

## 2016-02-03 MED ORDER — FENTANYL CITRATE (PF) 250 MCG/5ML IJ SOLN
INTRAMUSCULAR | Status: DC | PRN
  Administered 2016-02-03: 250 ug via INTRAVENOUS
  Administered 2016-02-03 (×3): 100 ug via INTRAVENOUS
  Administered 2016-02-03: 200 ug via INTRAVENOUS

## 2016-02-03 MED ORDER — MIDAZOLAM HCL 2 MG/2ML IJ SOLN
INTRAMUSCULAR | Status: AC
  Filled 2016-02-03: qty 2

## 2016-02-03 MED ORDER — FENTANYL CITRATE (PF) 250 MCG/5ML IJ SOLN
INTRAMUSCULAR | Status: AC
  Filled 2016-02-03: qty 5

## 2016-02-03 MED ORDER — FENTANYL CITRATE (PF) 250 MCG/5ML IJ SOLN
INTRAMUSCULAR | Status: AC
  Filled 2016-02-03: qty 10

## 2016-02-03 MED ORDER — MIDAZOLAM HCL 2 MG/2ML IJ SOLN
INTRAMUSCULAR | Status: DC | PRN
  Administered 2016-02-03: 2 mg via INTRAVENOUS

## 2016-02-03 MED ORDER — LIDOCAINE 2% (20 MG/ML) 5 ML SYRINGE
INTRAMUSCULAR | Status: AC
  Filled 2016-02-03: qty 5

## 2016-02-03 MED ORDER — ESMOLOL HCL 100 MG/10ML IV SOLN
INTRAVENOUS | Status: AC
Start: 2016-02-03 — End: 2016-02-03
  Filled 2016-02-03: qty 10

## 2016-02-03 MED ORDER — 0.9 % SODIUM CHLORIDE (POUR BTL) OPTIME
TOPICAL | Status: DC | PRN
  Administered 2016-02-03: 1000 mL
  Administered 2016-02-03: 2000 mL

## 2016-02-03 MED ORDER — PHENYLEPHRINE HCL 10 MG/ML IJ SOLN
10.0000 mg | INTRAVENOUS | Status: DC | PRN
  Administered 2016-02-03: 40 ug/min via INTRAVENOUS
  Administered 2016-02-03: 25 ug/min via INTRAVENOUS

## 2016-02-03 MED ORDER — ONDANSETRON HCL 4 MG/2ML IJ SOLN
INTRAMUSCULAR | Status: AC
  Filled 2016-02-03: qty 2

## 2016-02-03 SURGICAL SUPPLY — 48 items
BLADE SURG ROTATE 9660 (MISCELLANEOUS) IMPLANT
BNDG GAUZE ELAST 4 BULKY (GAUZE/BANDAGES/DRESSINGS) ×3 IMPLANT
CANISTER SUCTION 2500CC (MISCELLANEOUS) ×3 IMPLANT
CHLORAPREP W/TINT 26ML (MISCELLANEOUS) IMPLANT
COVER SURGICAL LIGHT HANDLE (MISCELLANEOUS) ×3 IMPLANT
DRAPE LAPAROSCOPIC ABDOMINAL (DRAPES) ×3 IMPLANT
DRAPE WARM FLUID 44X44 (DRAPE) ×3 IMPLANT
DRSG OPSITE POSTOP 4X10 (GAUZE/BANDAGES/DRESSINGS) IMPLANT
DRSG OPSITE POSTOP 4X8 (GAUZE/BANDAGES/DRESSINGS) IMPLANT
DRSG PAD ABDOMINAL 8X10 ST (GAUZE/BANDAGES/DRESSINGS) ×3 IMPLANT
ELECT BLADE 6.5 EXT (BLADE) IMPLANT
ELECT CAUTERY BLADE 6.4 (BLADE) ×3 IMPLANT
ELECT REM PT RETURN 9FT ADLT (ELECTROSURGICAL) ×3
ELECTRODE REM PT RTRN 9FT ADLT (ELECTROSURGICAL) ×1 IMPLANT
GLOVE BIO SURGEON STRL SZ8 (GLOVE) ×18 IMPLANT
GLOVE BIOGEL PI IND STRL 8 (GLOVE) ×2 IMPLANT
GLOVE BIOGEL PI IND STRL 8.5 (GLOVE) ×2 IMPLANT
GLOVE BIOGEL PI INDICATOR 8 (GLOVE) ×4
GLOVE BIOGEL PI INDICATOR 8.5 (GLOVE) ×4
GLOVE SURG SS PI 8.0 STRL IVOR (GLOVE) ×3 IMPLANT
GOWN STRL REUS W/ TWL LRG LVL3 (GOWN DISPOSABLE) IMPLANT
GOWN STRL REUS W/ TWL XL LVL3 (GOWN DISPOSABLE) ×4 IMPLANT
GOWN STRL REUS W/TWL LRG LVL3 (GOWN DISPOSABLE)
GOWN STRL REUS W/TWL XL LVL3 (GOWN DISPOSABLE) ×8
KIT BASIN OR (CUSTOM PROCEDURE TRAY) ×3 IMPLANT
KIT ROOM TURNOVER OR (KITS) ×3 IMPLANT
LIGASURE IMPACT 36 18CM CVD LR (INSTRUMENTS) ×3 IMPLANT
NS IRRIG 1000ML POUR BTL (IV SOLUTION) ×6 IMPLANT
PACK GENERAL/GYN (CUSTOM PROCEDURE TRAY) ×3 IMPLANT
PAD ARMBOARD 7.5X6 YLW CONV (MISCELLANEOUS) ×3 IMPLANT
RELOAD PROXIMATE 75MM BLUE (ENDOMECHANICALS) ×6 IMPLANT
SPECIMEN JAR LARGE (MISCELLANEOUS) IMPLANT
SPONGE LAP 18X18 X RAY DECT (DISPOSABLE) IMPLANT
STAPLER GUN LINEAR PROX 60 (STAPLE) ×3 IMPLANT
STAPLER PROXIMATE 75MM BLUE (STAPLE) ×3 IMPLANT
STAPLER VISISTAT 35W (STAPLE) ×3 IMPLANT
SUCTION POOLE TIP (SUCTIONS) ×3 IMPLANT
SUT NOVA 1 T20/GS 25DT (SUTURE) ×3 IMPLANT
SUT PDS AB 1 TP1 96 (SUTURE) ×6 IMPLANT
SUT VIC AB 2-0 SH 18 (SUTURE) ×6 IMPLANT
SUT VIC AB 3-0 SH 18 (SUTURE) ×6 IMPLANT
SUT VICRYL AB 2 0 TIES (SUTURE) ×3 IMPLANT
SUT VICRYL AB 3 0 TIES (SUTURE) ×3 IMPLANT
TAPE CLOTH SURG 6X10 WHT LF (GAUZE/BANDAGES/DRESSINGS) ×3 IMPLANT
TOWEL OR 17X24 6PK STRL BLUE (TOWEL DISPOSABLE) ×3 IMPLANT
TOWEL OR 17X26 10 PK STRL BLUE (TOWEL DISPOSABLE) ×3 IMPLANT
TRAY FOLEY CATH 16FRSI W/METER (SET/KITS/TRAYS/PACK) IMPLANT
YANKAUER SUCT BULB TIP NO VENT (SUCTIONS) IMPLANT

## 2016-02-03 NOTE — Progress Notes (Signed)
Return to OR for ex lap  Possible SBR and possible closure with anastomosis    The procedure has been discussed with the patient family .  Alternative therapies have been discussed with the patient.  Operative risks include bleeding,  Infection,  Organ injury,  Nerve injury,  Blood vessel injury,  DVT,  Pulmonary embolism,  Death,  And possible reoperation.  Medical management risks include worsening of present situation.  The success of the procedure is 50 -90 % at treating patients symptoms.  The patient understands and agrees to proceed.

## 2016-02-03 NOTE — Progress Notes (Signed)
PULMONARY / CRITICAL CARE MEDICINE   Name: Brian Baxter MRN: [id number] DOB: [birth year]    ADMISSION DATE:  02/19/2016 CONSULTATION DATE:  01/31/2016  REFERRING MD:  Dr. Arbie CookeyEarly  CHIEF COMPLAINT:  Abdominal pain  HISTORY OF PRESENT ILLNESS:   60 year old male with a past medical history significant for hypertension and tobacco abuse was [facility] in the setting of small bowel ischemia and possible right kidney ischemia. He has been admitted to an outside hospital on 01/25/2016 in the setting of visual changes. He was noted on MRI brain to have multiple strokes, one in the right occipital lobe which was the largest. During that hospitalization at the outside hospital he underwent an extensive workup to look for an etiology of embolic clot which was felt to be the cause of his strokes. He had a TTE with contrast which initially was suggestive of a patent foramen ovale but a follow-up TEE on the following day with contrast showed no evidence of right to left shunt. Both studies showed no evidence of vegetation on the valve. He had an MRI of his abdomen which showed that the right iliac vein was slightly compressed by the overlying iliac artery there was no evidence of venous clots. He also had a lower extremity Doppler ultrasound which was negative. He was discharged on a statin and aspirin instructions to quit smoking.  On 01/29/2016 he developed severe abdominal pain and went to another outside hospital and then was transferred to us for further evaluation. He was initially accepted by us for consideration of TPA infusion by our interventional radiology group but considering his recent strokes vascular and general surgery were consulted for further evaluation.  He was taken emergently to the operating room on 2016-08-04 where he was found to have ischemic bowel near the ileum. Bowel was resected and no re-anastomosis attempts were made, he was left open with a  wound VAC in place. He was transferred to the intensive care unit intubated on a low-dose of Neo-Synephrine.    SUBJECTIVE:  Sedated on vent Note plans for return to OR Friday (6/9)  VITAL SIGNS: BP 138/65 mmHg  Pulse 86  Temp(Src) 98.4 F (36.9 C) (Axillary)  Resp 16  Ht 5\' 9"  (1.753 m)  Wt 167 lb 12.3 oz (76.1 kg)  BMI 24.76 kg/m2  SpO2 99%  HEMODYNAMICS:    VENTILATOR SETTINGS: Vent Mode:  [-] CPAP;PSV FiO2 (%):  [40 %-50 %] 40 % Set Rate:  [14 bmp] 14 bmp Vt Set:  [570 mL] 570 mL PEEP:  [5 cmH20] 5 cmH20 Pressure Support:  [5 cmH20] 5 cmH20 Plateau Pressure:  [17 cmH20-19 cmH20] 17 cmH20  INTAKE / OUTPUT: I/O last 3 completed shifts: In: 7223.2 [I.V.:6393.2; NG/GT:460; IV Piggyback:370] Out: 6320 [Urine:1795; Emesis/NG output:1800; Drains:2725]  PHYSICAL EXAMINATION: General:  Sedated on vent Neuro:  Sedated, MAE spontaneously HEENT:  Normocephalic/atraumatic, endotracheal tube in place Cardiovascular:  Regular rate and rhythm, no murmurs gallops rubs Lungs:  Coarse to auscultation bilaterally with vent supported breaths Abdomen:  No bowel sounds, abdominal wound VAC in place Musculoskeletal:  Normal bulk and tone Skin:  Large wound VAC in place abdomen  LABS:  BMET  Recent Labs Lab 02/02/16 0338 02/02/16 0430 01/26/2016 0726  NA 129* 132* 133*  K 4.4 4.4 4.4  CL 104 108 108  CO2 21* 20* 20*  BUN 11 9 10   CREATININE 0.75 0.71 0.72  GLUCOSE 168* 151* 147*    Electrolytes  Recent Labs  Lab 01/31/16 0230  02/08/2016 0400 02/02/16 0338 02/02/16 0430 02/22/2016 0726  CALCIUM 8.2*  < > 8.1* 6.8* 7.2* 7.3*  MG 2.0  --  2.1 1.8  --   --   PHOS  --   < > 2.6 2.5 2.5 2.6  < > = values in this interval not displayed.  CBC  Recent Labs Lab 02/20/2016 0400  02/15/2016 1655 02/02/16 0338 02/24/2016 0400  WBC 17.4*  --   --  21.1* 23.9*  HGB 10.7*  < > 10.5* 11.1* 10.3*  HCT 31.7*  < > 31.0* 33.5* 30.2*  PLT 179  --   --  145* 89*  < > = values in  this interval not displayed.  Coag's  Recent Labs Lab 01/31/16 0230 01/31/16 0505 02/17/2016 0400 02/02/16 0338  APTT 32  --  39* 35  INR 1.18 1.25 1.29 1.43    Sepsis Markers  Recent Labs Lab 02/14/2016 2207 01/31/16 0230  LATICACIDVEN 0.8 0.9    ABG  Recent Labs Lab 02/22/2016 1655 02/02/2016 0327 02/20/2016 0400  PHART 7.342* 7.357 7.336*  PCO2ART 40.3 34.5* 34.7*  PO2ART 285.0* 75.9* 83.0    Liver Enzymes  Recent Labs Lab 02/12/2016 2207 01/31/16 0505 02/02/16 0430 02/08/2016 0726  AST 28 25  --   --   ALT 34 32  --   --   ALKPHOS 70 59  --   --   BILITOT 0.9 0.5  --   --   ALBUMIN 2.9* 2.5* 1.3* 1.2*    Cardiac Enzymes No results for input(s): TROPONINI, PROBNP in the last 168 hours.  Glucose No results for input(s): GLUCAP in the last 168 hours.  Imaging Dg Chest Port 1 View  02/15/2016  CLINICAL DATA:  Ventilator dependent respiratory failure. Followup left lower lobe atelectasis and/or pneumonia. EXAM: PORTABLE CHEST 1 VIEW COMPARISON:  02/02/2016 and earlier. FINDINGS: Endotracheal tube tip in satisfactory position below the thoracic inlet. Right subclavian central venous catheter tip projects over the mid SVC, unchanged. Nasogastric tube courses below the diaphragm into the stomach. Persistent dense consolidation in the left lower lobe. Interval worsening of airspace opacities in the right lung base. Cardiac silhouette mildly to moderately enlarged, unchanged. Pulmonary venous hypertension without overt edema currently. IMPRESSION: 1.  Support apparatus satisfactory. 2. Stable dense left lower lobe atelectasis and/or pneumonia. 3. Worsening right basilar atelectasis and/or pneumonia. Electronically Signed   By: Hulan Saas M.D.   On: 01/31/2016 07:53     STUDIES:  01/26/2016 outside hospital TTE: Left atrial enlargement, contrast suggestive of patent foramen ovale 01/26/2016 bilateral carotid ultrasound: No significant stenosis 01/26/2016 ultrasound  Doppler legs>  negative for  DVT 01/27/2016 MR angiogram pelvis: Mild compression of the left common iliac vein without significant narrowing or thrombosis 01/27/2016 outside hospital TTE> Normal valves, left atrial enlargement, there is no abnormal contrast to suggest intra-atrial shunting 6/5 CT abdomen OSH> CT angiogram findings worrisome for superior mesenteric artery clot, small bowel ischemia, infarcts to kidney and spleen.  CULTURES: None  ANTIBIOTICS: 01/31/2016 ertapenem >>   SIGNIFICANT EVENTS: June Fifth 2017 CT angiogram findings worrisome for superior mesenteric artery clot, taken to the operating room where several inches of ileum were resected  LINES/TUBES: 01/29/2016 endotracheal tube  DISCUSSION: This is a 60 year old male who has small bowel ischemia, possible renal infarct in the setting of the superior mesenteric artery clot. This occurs several days after recent hospitalization for stroke and what was felt to be an embolic cause of his  stroke. The etiology of his underlying hypercoagulable state is uncertain, is still uncertain if there is one common clot leading to embolic phenomena but the extensive workup performed at the outside hospital was suggest otherwise. At this time he remains critically ill after having emergent bowel resection and he is sedated on the ventilator.   ASSESSMENT / PLAN:  PULMONARY A: Mechanical ventilatory support postoperative History of tobacco use ? Left lower lobe pna P:   PSV 40% 5/5 OR today for possible closure Evaluate after surgery Consider vent wean/extubation if abdomen closed Keep sedated on vent for now with open abd.   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 Shock postoperatively, likely sedation related P:  Vascular surgery involved in open abd 6/6; vascular bypass on 6/7 Telemetry monitoring Hold anticoagulation given recent large ischemic stroke Titrate NEO to  maintain mean arterial pressure greater than 65 Hypercoag workup pending - follow up with hematology @ some point  Continue aspirin if okay by vascular surgery and general surgery Neurology consult  Consider repeat TEE or TTE  RENAL A:   Concern for partial right renal infarct on CT angiogram, normal renal function P:   Monitor BMET and UOP Replace electrolytes as needed   GASTROINTESTINAL A:   Small bowel ischemia secondary to superior mesenteric artery clot Post laparotomy with open abd P:   Postoperative care for general surgery Nothing by mouth PPI  HEMATOLOGIC A:   Concern for hypercoagulable state Stents of the workup for embolic cause of multiple arterial strokes negative at outside hospital 3-4 days prior to admission P:  Hypercoagulability panel > AT III level normal, rest is pending Hold heparin until okay by neurology, presumably June 13 (14 days post stroke)  INFECTIOUS A:   Peritonitis P:   Ertapenem 6/6>>>  ENDOCRINE A:   No acute  issues P:   Monitor glucose  NEUROLOGIC A:   Recent stroke P:   RASS goal: -3 Heavy sedation while abdomen open Fentanyl gtt Versed prn   FAMILY  - Updates: none bedside on my exam  - Inter-disciplinary family meet or Palliative Care meeting due by:  day 7  Jacqulyn Cane FNP, ACNP-S/ Simonne Martinet ACNP-BC [facility] Pulmonary/Critical Care Pager # [phone] OR # [phone] if no answer  02/14/2016, 11:36 AM   Attending Note:  I have examined patient, reviewed labs, studies and notes. I have discussed the case with S Kobashigawa and Kreg Shropshire, and I agree with the data and plans as amended above.   60 yo man, hx of recent CVA and suspected hypercoag state, intubated and deeply sedated s/p ileum resection and possible renal infarct from SMA thrombosis. He is sedated, intubated, has clear lungs. He will wake to voice but quickly back to sleep. Hemodynamics are stable. Planning for trip to OR today for possible abdominal  closure. Afterwards we will start to progress with SBT's. Hypercoag labs are pending. Will continue fluid boluses to account for wound vac losses.  Independent critical care time is 35 minutes.   Levy Pupa, MD, PhD 02/24/2016, 1:33 PM [facility] [phone] or if no answer [phone]

## 2016-02-03 NOTE — Progress Notes (Signed)
Brian Baxter   DOB:[birth year]   ZO#:[id number]    Subjective: Patient remains sedated and intubated. Family members were not present. Nursing staff did not report excessive bleeding  Objective:  Filed Vitals:   02/07/2016 0700 02/20/2016 0800  BP:    Pulse: 76 76  Temp:    Resp: 15 15     Intake/Output Summary (Last 24 hours) at 02/21/2016 0847 Last data filed at 02/12/2016 0800  Gross per 24 hour  Intake 4698.8 ml  Output   4765 ml  Net  -66.2 ml    GENERAL:He is intubated and sedated SKIN: skin color, texture, turgor are normal, no rashes or significant lesions ABDOMEN:abdomen soft, presence of wound VAC  NEURO: Sedated and intubated  Labs:  Lab Results  Component Value Date   WBC 23.9* 02/16/2016   HGB 10.3* 02/12/2016   HCT 30.2* 02/19/2016   MCV 89.1 02/19/2016   PLT 89* 02/13/2016    Lab Results  Component Value Date   NA 133* 02/12/2016   K 4.4 02/08/2016   CL 108 02/08/2016   CO2 20* 01/30/2016    Studies:  Dg Chest Port 1 View  02/23/2016  CLINICAL DATA:  Ventilator dependent respiratory failure. Followup left lower lobe atelectasis and/or pneumonia. EXAM: PORTABLE CHEST 1 VIEW COMPARISON:  02/02/2016 and earlier. FINDINGS: Endotracheal tube tip in satisfactory position below the thoracic inlet. Right subclavian central venous catheter tip projects over the mid SVC, unchanged. Nasogastric tube courses below the diaphragm into the stomach. Persistent dense consolidation in the left lower lobe. Interval worsening of airspace opacities in the right lung base. Cardiac silhouette mildly to moderately enlarged, unchanged. Pulmonary venous hypertension without overt edema currently. IMPRESSION: 1.  Support apparatus satisfactory. 2. Stable dense left lower lobe atelectasis and/or pneumonia. 3. Worsening right basilar atelectasis and/or pneumonia. Electronically Signed   By: Hulan Saas M.D.   On: 02/02/2016 07:53   Dg Chest Port 1 View  02/02/2016  CLINICAL DATA:   Respiratory failure EXAM: PORTABLE CHEST 1 VIEW COMPARISON:  02/20/2016 FINDINGS: There is an ET tube with tip above the carina. There is a right IJ catheter tip in the projection of the SVC. Moderate cardiac enlargement identified. Aortic atherosclerosis noted. There is a moderate left pleural effusion. Diminished aeration to the right midlung and left base is similar to previous exam. Fluid along the minor fissure is identified within the right midlung. IMPRESSION: 1. Persistent decreased aeration to the left midlung and left base. 2. Bilateral pleural effusions left greater than right. Electronically Signed   By: Signa Kell M.D.   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 The only mild abnormality seen is borderline low protein C level but that could be appropriately depressed in the setting of recent blood clot. Other tests are still pending Consider DVT prophylaxis once surgeons consider appropriate and the risk of bleeding is acceptable  #6 leukocytosis Likely due to recent surgery and possibly infection. He is on antibiotic therapy  #7 anemia of chronic disease Likely  due to consumption from recent surgery and anemia chronic disease. Monitor closely  #8 thrombocytopenia This is acute in nature, likely due to recent consumption from surgery and mild bone marrow depression from side effects of antibiotic therapy There  is no contraindication for him to be placed on antiplatelet agents or anticoagulation therapy as long as his platelet count remained greater than 50,000  Discharge planning I will return to check on him on Monday, 02/06/2016  [facility] HealthcareGORSUCH, Odeth Bry, MD 02/04/2016  8:47 AM

## 2016-02-03 NOTE — Anesthesia Preprocedure Evaluation (Signed)
Anesthesia Evaluation  Patient identified by MRN, date of birth, ID band Patient unresponsive    Reviewed: Allergy & Precautions, NPO status , Patient's Chart, lab work & pertinent test results  History of Anesthesia Complications Negative for: history of anesthetic complications  Airway Mallampati: Intubated       Dental   Pulmonary Current Smoker,  Remains intubated: mesenteric ischemia, wound vac with open abdomen Respiratory failure   Pulmonary exam normal breath sounds clear to auscultation       Cardiovascular hypertension, Pt. on medications + Peripheral Vascular Disease (mesenteric ischemia)  Normal cardiovascular exam Rhythm:Regular Rate:Normal  01/27/16 ECHO: normal LVF, EF 50-55%, valves OK Mesenteric artery thrombosis with infarction   Neuro/Psych Intubated and sedated TIACVA, Residual Symptoms negative psych ROS   GI/Hepatic Neg liver ROS, GERD  Medicated,Mesenteric ischemia, now with wound vac s/p bowel resection   Endo/Other  negative endocrine ROS  Renal/GU Renal diseaseRenal infarct  negative genitourinary   Musculoskeletal negative musculoskeletal ROS (+)   Abdominal   Peds  Hematology  (+) Blood dyscrasia (Hb 10.7), anemia , Thrombocytopenia   Anesthesia Other Findings   Reproductive/Obstetrics                             Anesthesia Physical  Anesthesia Plan  ASA: IV  Anesthesia Plan: General   Post-op Pain Management:    Induction: Intravenous and Inhalational  Airway Management Planned: Oral ETT  Additional Equipment: Arterial line  Intra-op Plan:   Post-operative Plan: Post-operative intubation/ventilation  Informed Consent: I have reviewed the patients History and Physical, chart, labs and discussed the procedure including the risks, benefits and alternatives for the proposed anesthesia with the patient or authorized representative who has indicated  his/her understanding and acceptance.   Consent reviewed with POA  Plan Discussed with: Surgeon, CRNA and Anesthesiologist  Anesthesia Plan Comments: (Plan routine monitors, GETA with existing ETT, post op ventilation )        Anesthesia Quick Evaluation

## 2016-02-03 NOTE — Op Note (Signed)
Preoperative diagnosis: Mesenteric ischemia with previous history of small bowel resection and aortomesenteric bypass with open abdomen  Postoperative diagnosis: Same  Procedure: Exploratory laparotomy with small bowel resection and creation of a side-to-side small bowel anastomosis with closure of abdomen  Surgeon: Harriette Bouillonhomas Giabella Duhart M.D.  Asst.: Dr. Violeta GelinasBurke Thompson M.D.  Anesthesia: Gen.  EBL: Minimal  Specimen distal jejunum to pathology and tip of proximal ileum pathology  Drains: None  Indications for procedure: The patient is a 60 year old male with severe mesenteric ischemia who presented with ischemic small bowel 4 days ago. He underwent a laparotomy with small bowel resection and subsequent his abdomen was left open. He returned 2 days ago for a second look operation as well as an aorto mesenteric bypass due to critical stenosis of the celiac, SMA and IMA vessels. He returns today for his third look operation with potential anastomosis and closure of his abdomen. I discussed the situation with the family prior to surgery explained the possible outcomes. He has significant small bowel (of small bowel to live on enteral nutrition.The procedure has been discussed with the patient.  Alternative therapies have been discussed with the patient.  Operative risks include bleeding,  Infection,  Organ injury,  Nerve injury,  Blood vessel injury,  DVT,  Pulmonary embolism,  Death,  And possible reoperation.  Medical management risks include worsening of present situation.  The success of the procedure is 50 -90 % at treating patients symptoms.  The patient understands and agrees to proceed.  Description of procedure: The patient was brought directly from the ICU to the operating room and placed supine. Gen. anesthesia was initiated the wound VAC was removed and the abdomen was prepped and draped in sterile fashion. Timeout was done. Upon examination of the small bowel at the area of distal jejunum for  about 25 cm was ischemic and spots. This was about the same as his last operation if not worse. With his rising white count this bowel was not felt to be is up viable. Without the LigaSure this was excised in a GIA 75 separate device was used to excise that. The proximal 5 cm of distal ileum looked ischemic and this was excised using the GIA 75 stapling device. The remainder the small bowel had good blood flow, pulses, and looked viable. The ascending, transverse, descending colon and rectum were all viable.  A side-to-side functional end anastomosis was created between the jejunum and ileum using a GIA-75 stapling device with the common enterotomy closed with a TA 60. I oversewed this with 3-0 Vicryl. A crotch stitch placed in the anastomosis. Mesenteric defect closed with 2-0 Vicryl. The abdominal cavity is irrigated. I was able to close the fascia with double-stranded #1 PDS and interrupted #1 Novafil pop ops.  Skin packed open with saline soaked Kerlix. All final counts sponge, needles and instruments found to be correct this portion of the case. Patient was taken back to the ICU intubated in critical condition.

## 2016-02-03 NOTE — Progress Notes (Signed)
    Subjective  - s/p SMA bypass  Remains intubated and sedated   Physical Exam:  Abdominal vac in place Feet warm       Assessment/Plan:    WBC trended up today, and he remains acidotic.  General surgery for possible re-look in OR today Will be available if there are any queastions  Brabham, Wells 02/23/2016 7:54 AM --  Filed Vitals:   02/12/2016 0600 02/22/2016 0700  BP:    Pulse: 79 76  Temp:    Resp: 14 15    Intake/Output Summary (Last 24 hours) at 02/05/2016 0754 Last data filed at 02/12/2016 0700  Gross per 24 hour  Intake 4513.8 ml  Output   4855 ml  Net -341.2 ml     Laboratory CBC    Component Value Date/Time   WBC 23.9* 01/28/2016 0400   HGB 10.3* 02/13/2016 0400   HCT 30.2* 02/06/2016 0400   PLT 89* 01/26/2016 0400    BMET    Component Value Date/Time   NA 132* 02/02/2016 0430   K 4.4 02/02/2016 0430   CL 108 02/02/2016 0430   CO2 20* 02/02/2016 0430   GLUCOSE 151* 02/02/2016 0430   BUN 9 02/02/2016 0430   CREATININE 0.71 02/02/2016 0430   CALCIUM 7.2* 02/02/2016 0430   GFRNONAA >60 02/02/2016 0430   GFRAA >60 02/02/2016 0430    COAG Lab Results  Component Value Date   INR 1.43 02/02/2016   INR 1.29 2016/02/21   INR 1.25 01/31/2016   No results found for: PTT  Antibiotics Anti-infectives    Start     Dose/Rate Route Frequency Ordered Stop   01/31/16 0400  ertapenem (INVANZ) 1 g in sodium chloride 0.9 % 50 mL IVPB     1 g 100 mL/hr over 30 Minutes Intravenous Every 24 hours 01/31/16 0334     01/31/16 0100  [MAR Hold]  cefOXitin (MEFOXIN) 2 g in dextrose 5 % 50 mL IVPB     (MAR Hold since 01/31/16 0049)   2 g 100 mL/hr over 30 Minutes Intravenous To Surgery 01/31/16 0046 01/31/16 0118       V. Charlena CrossWells Brabham IV, M.D. [facility] Office: [phone] Pager:  [phone]

## 2016-02-03 NOTE — Transfer of Care (Signed)
Immediate Anesthesia Transfer of Care Note  Patient: Brian Baxter  Procedure(s) Performed: Procedure(s): EXPLORATORY LAPAROTOMY (N/A) SMALL BOWEL RESECTION (N/A)  Patient Location: SICU  Anesthesia Type:General  Level of Consciousness: sedated, unresponsive and Patient remains intubated per anesthesia plan  Airway & Oxygen Therapy: Patient remains intubated per anesthesia plan and Patient placed on Ventilator (see vital sign flow sheet for setting)  Post-op Assessment: Report given to RN and Post -op Vital signs reviewed and stable  Post vital signs: Reviewed and stable  Last Vitals:  Filed Vitals:   02/14/2016 1000 02/22/2016 1236  BP:  143/62  Pulse: 86 94  Temp:    Resp: 16 14    Last Pain:  Filed Vitals:   02/24/2016 1236  PainSc: Asleep         Complications: No apparent anesthesia complications

## 2016-02-03 NOTE — Care Management Note (Signed)
Case Management Note  Patient Details  Name: Brian Baxter MRN: [id number] Date of Birth: [birth year]  Subjective/Objective:    s/p Procedure(s): SMALL BOWEL RESECTION (N/A) EXPLORATORY LAPAROTOMY (N/A                Action/Plan: PTA - independent from home with wife.  Current plan is for pt to remain on vent until abd wound closed.  CM will continue to follow for disposition needs   Expected Discharge Date:                  Expected Discharge Plan:  Home w Home Health Services  In-House Referral:     Discharge planning Services  CM Consult  Post Acute Care Choice:    Choice offered to:     DME Arranged:    DME Agency:     HH Arranged:    HH Agency:     Status of Service:  In process, will continue to follow  Medicare Important Message Given:    Date Medicare IM Given:    Medicare IM give by:    Date Additional Medicare IM Given:    Additional Medicare Important Message give by:     If discussed at Long Length of Stay Meetings, dates discussed:    Additional Comments: 02/06/2016 Procedure: Exploratory laparotomy with small bowel resection and creation of a side-to-side small bowel anastomosis with closure of abdomen.  Pt remains on ventilator.  CM will continue to monitor for disposition needs Brian Baxter, Hermine Feria S, RN 02/18/2016, 3:21 PM

## 2016-02-04 ENCOUNTER — Inpatient Hospital Stay (HOSPITAL_COMMUNITY): Payer: BLUE CROSS/BLUE SHIELD

## 2016-02-04 DIAGNOSIS — I749 Embolism and thrombosis of unspecified artery: Secondary | ICD-10-CM

## 2016-02-04 LAB — GLUCOSE, CAPILLARY: Glucose-Capillary: 122 mg/dL — ABNORMAL HIGH (ref 65–99)

## 2016-02-04 LAB — BLOOD GAS, ARTERIAL
Acid-base deficit: 4.8 mmol/L — ABNORMAL HIGH (ref 0.0–2.0)
BICARBONATE: 19.4 meq/L — AB (ref 20.0–24.0)
Drawn by: 36496
FIO2: 0.4
LHR: 14 {breaths}/min
O2 Saturation: 95.9 %
PATIENT TEMPERATURE: 98.3
PEEP/CPAP: 5 cmH2O
PO2 ART: 81.5 mmHg (ref 80.0–100.0)
TCO2: 20.4 mmol/L (ref 0–100)
VT: 570 mL
pCO2 arterial: 33.2 mmHg — ABNORMAL LOW (ref 35.0–45.0)
pH, Arterial: 7.383 (ref 7.350–7.450)

## 2016-02-04 LAB — CBC
HCT: 29 % — ABNORMAL LOW (ref 39.0–52.0)
HEMOGLOBIN: 9.7 g/dL — AB (ref 13.0–17.0)
MCH: 29.4 pg (ref 26.0–34.0)
MCHC: 33.4 g/dL (ref 30.0–36.0)
MCV: 87.9 fL (ref 78.0–100.0)
Platelets: 59 10*3/uL — ABNORMAL LOW (ref 150–400)
RBC: 3.3 MIL/uL — AB (ref 4.22–5.81)
RDW: 14.7 % (ref 11.5–15.5)
WBC: 21.8 10*3/uL — AB (ref 4.0–10.5)

## 2016-02-04 LAB — PHOSPHORUS: Phosphorus: 2.9 mg/dL (ref 2.5–4.6)

## 2016-02-04 LAB — MAGNESIUM: MAGNESIUM: 1.9 mg/dL (ref 1.7–2.4)

## 2016-02-04 MED ORDER — DEXMEDETOMIDINE HCL IN NACL 400 MCG/100ML IV SOLN
0.4000 ug/kg/h | INTRAVENOUS | Status: DC
  Administered 2016-02-04: 0.4 ug/kg/h via INTRAVENOUS
  Administered 2016-02-04: 0.5 ug/kg/h via INTRAVENOUS
  Administered 2016-02-05: 0.4 ug/kg/h via INTRAVENOUS
  Administered 2016-02-05: 0.5 ug/kg/h via INTRAVENOUS
  Administered 2016-02-05: 0.4 ug/kg/h via INTRAVENOUS
  Administered 2016-02-06: 0.7 ug/kg/h via INTRAVENOUS
  Filled 2016-02-04 (×2): qty 50
  Filled 2016-02-04: qty 100
  Filled 2016-02-04 (×5): qty 50

## 2016-02-04 NOTE — Progress Notes (Addendum)
[facility] [place]  Subjective  - Intubated does not respond nor follow commands.   Objective 102/49 78 98.3 F (36.8 C) (Oral) 22 95%  Intake/Output Summary (Last 24 hours) at 02/04/16 16100733 Last data filed at 02/04/16 0700  Gross per 24 hour  Intake 4412.75 ml  Output   2775 ml  Net 1637.75 ml    Feet warm well perused Abdomin soft, wound vac in place  Assessment/Planning: POD # 3 Second look exploratory laparotomy followed by infrarenal aorta to superior mesenteric artery bypass with saphenous vein and superior mesenteric artery thrombectomy POD#1 Exploratory laparotomy with small bowel resection and creation of a side-to-side small bowel anastomosis with closure of abdomen  WBC down 21.8 from 23.9 HGB 9.7 Cr 0.71, UO 40cc /hr    Clinton GallantCOLLINS, EMMA [facility] 02/04/2016 7:33 AM --  Laboratory Lab Results:  Recent Labs  02/07/2016 0400 02/04/16 0410  WBC 23.9* 21.8*  HGB 10.3* 9.7*  HCT 30.2* 29.0*  PLT 89* 59*   BMET  Recent Labs  02/04/2016 0726 02/24/2016 1445  NA 133* 133*  K 4.4 4.5  CL 108 109  CO2 20* 20*  GLUCOSE 147* 157*  BUN 10 10  CREATININE 0.72 0.71  CALCIUM 7.3* 7.3*    COAG Lab Results  Component Value Date   INR 1.43 02/02/2016   INR 1.29 2016/06/24   INR 1.25 01/31/2016   No results found for: PTT    I agree with the above.  The patient had a second piece of small bowel removed yesterday with primary anastomosis and abdominal closure.  All other bowel remained viable.  I discussed this with Dr. Luisa Hartornett. We'll continue to try and wean patient from the ventilator.  He is not currently following commands.  There is certainly concern over possible neurologic issues given his multiple distribution of infarcts.  Continue with IV heparin.  Durene CalWells Brandye Inthavong

## 2016-02-04 NOTE — Progress Notes (Signed)
PULMONARY / CRITICAL CARE MEDICINE   Name: Brian Baxter MRN: [id number] DOB: 19-Mar-1956    ADMISSION DATE:  02/24/2016 CONSULTATION DATE:  01/31/2016  REFERRING MD:  Dr. Arbie Cookey  CHIEF COMPLAINT:  Abdominal pain  HISTORY OF PRESENT ILLNESS:   60 year old male with hx HTN, tobacco abuse admitted 02/21/2016 in the setting of small bowel ischemia and possible right kidney ischemia. He was taken urgently to OR with gen surgery and vascular, abd left open with wound vac.  Tx to ICU intubated, on pressors.    Previous admission to outside hospital 5/31 where MRI brain noted multiple strokes.  During that admit he had extensive w/u to look for etiology of clot which was ess negative including neg TEE.    SUBJECTIVE:  Sedated on vent Back to OR yesterday for abd closure  Remains on low dose neo   VITAL SIGNS: BP 111/52 mmHg  Pulse 85  Temp(Src) 98.1 F (36.7 C) (Oral)  Resp 23  Ht  (1.753 m)  Wt 84.1 kg (185 lb 6.5 oz)  BMI 27.37 kg/m2  SpO2 96%  HEMODYNAMICS:    VENTILATOR SETTINGS: Vent Mode:  [-] PRVC FiO2 (%):  [40 %] 40 % Set Rate:  [14 bmp] 14 bmp Vt Set:  [570 mL] 570 mL PEEP:  [5 cmH20] 5 cmH20 Pressure Support:  [5 cmH20] 5 cmH20 Plateau Pressure:  [19 cmH20-21 cmH20] 21 cmH20  INTAKE / OUTPUT: I/O last 3 completed shifts: In: 6873.8 [I.V.:6113.8; NG/GT:510; IV Piggyback:250] Out: 4975 [Urine:2050; Emesis/NG output:1775; Drains:1050; Blood:100]  PHYSICAL EXAMINATION: General:  Sedated on vent, NAD  Neuro:  RASS -2, stirs with stimulation, does not open eyes, does not follow commands  HEENT:  Normocephalic/atraumatic, endotracheal tube in place Cardiovascular:  Regular rate and rhythm, no murmurs gallops rubs Lungs:  resps even non labored on vent, scattered rhonchi  Abdomen:  No bowel sounds, abd incision - dressing c/d  Musculoskeletal:  Normal bulk and tone  LABS:  BMET  Recent Labs Lab 02/02/16 0430 02/06/2016 0726 02/17/2016 1445  NA 132* 133*  133*  K 4.4 4.4 4.5  CL 108 108 109  CO2 20* 20* 20*  BUN CREATININE 0.71 0.72 0.71  GLUCOSE 151* 147* 157*    Electrolytes  Recent Labs Lab 02/08/2016 0400 02/02/16 0338 02/02/16 0430 02/17/2016 0726 02/13/2016 1445 02/04/16 0410  CALCIUM 8.1* 6.8* 7.2* 7.3* 7.3*  --   MG 2.1 1.8  --   --   --  1.9  PHOS 2.6 2.5 2.5 2.6 3.0 2.9    CBC  Recent Labs Lab 02/02/16 0338 01/31/2016 0400 02/04/16 0410  WBC 21.1* 23.9* 21.8*  HGB 11.1* 10.3* 9.7*  HCT 33.5* 30.2* 29.0*  PLT 145* 89* 59*    Coag's  Recent Labs Lab 01/31/16 0230 01/31/16 0505 02/15/2016 0400 02/02/16 0338  APTT 32  --  39* 35  INR 1.18 1.25 1.29 1.43    Sepsis Markers  Recent Labs Lab 02/21/2016 2207 01/31/16 0230  LATICACIDVEN 0.8 0.9    ABG  Recent Labs Lab 02/21/2016 0327 01/29/2016 0400 02/04/16 0418  PHART 7.357 7.336* 7.383  PCO2ART 34.5* 34.7* 33.2*  PO2ART 75.9* 83.0 81.5    Liver Enzymes  Recent Labs Lab 02/04/2016 2207 01/31/16 0505 02/02/16 0430 02/21/2016 0726 02/19/2016 1445  AST 28 25  --   --   --   ALT 34 32  --   --   --   ALKPHOS 70 59  --   --   --  BILITOT 0.9 0.5  --   --   --   ALBUMIN 2.9* 2.5* 1.3* 1.2* 1.2*    Cardiac Enzymes No results for input(s): TROPONINI, PROBNP in the last 168 hours.  Glucose  Recent Labs Lab 02/06/2016 1628  GLUCAP 139*    Imaging Dg Chest Port 1 View  02/04/2016  CLINICAL DATA:  Acute respiratory failure EXAM: PORTABLE CHEST 1 VIEW COMPARISON:  February 03, 2016 FINDINGS: Stable support apparatus. Cardiomediastinal silhouette is stable. Persistent infiltrate in medial right base and left perihilar region. No other changes. IMPRESSION: Persistent pulmonary infiltrates and stable support apparatus. Electronically Signed   By: Gerome Samavid  Williams III M.D   On: 02/04/2016 07:54     STUDIES:  01/26/2016 outside hospital TTE: Left atrial enlargement, contrast suggestive of patent foramen ovale 01/26/2016 bilateral carotid ultrasound:  No significant stenosis 01/26/2016 ultrasound Doppler legs>  negative for  DVT 01/27/2016 MR angiogram pelvis: Mild compression of the left common iliac vein without significant narrowing or thrombosis 01/27/2016 outside hospital TTE> Normal valves, left atrial enlargement, there is no abnormal contrast to suggest intra-atrial shunting 6/5 CT abdomen OSH> CT angiogram findings worrisome for superior mesenteric artery clot, small bowel ischemia, infarcts to kidney and spleen.  CULTURES: None  ANTIBIOTICS: 01/31/2016 ertapenem >>   SIGNIFICANT EVENTS: 6/5 CTA abd >>>worrisome for superior mesenteric artery clot 6/5 OR >> small bowel resection, aortomesenteric bypass  6/9 back to OR>> abd closure   LINES/TUBES: 25-Dec-2015 endotracheal tube  DISCUSSION: This is a 60 year old male who has small bowel ischemia, possible renal infarct in the setting of the superior mesenteric artery clot. This occurs several days after recent hospitalization for stroke thought to be embolic.  Uncertain etiology of hypercoagulable state.  Remains critically ill, s/p surgical repair.     ASSESSMENT / PLAN:  PULMONARY A: Mechanical ventilatory support postoperative History of tobacco use ? 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 Shock postoperatively, likely sedation related P:  Vascular surgery following Hold anticoagulation given recent large ischemic stroke Titrate NEO as able to maintain mean arterial pressure greater than 65 Hypercoag workup pending - follow up with hematology @ some point  Continue aspirin if okay by vascular surgery and general surgery Neurology consult  Consider repeat TEE or TTE  RENAL A:   Concern for partial right renal infarct on CT angiogram, normal renal function P:   Monitor BMET and UOP Replace electrolytes as  needed   GASTROINTESTINAL A:   Small bowel ischemia secondary to superior mesenteric artery clot Post laparotomy with open abd P:   Postoperative care per general surgery Nothing by mouth PPI  HEMATOLOGIC A:   Concern for hypercoagulable state Stents of the workup for embolic cause of multiple arterial strokes negative at outside hospital 3-4 days prior to admission P:  Hypercoagulability panel > AT III level normal, rest is pending Hold heparin until okay by neurology, presumably June 13 (14 days post stroke)  INFECTIOUS A:   Peritonitis P:   Ertapenem 6/6>>>  ENDOCRINE A:   No acute  issues P:   Monitor glucose  NEUROLOGIC A:   Recent stroke P:   RASS goal: -1 Lighten sedation now that abd closed  Fentanyl gtt Versed prn   FAMILY  - Updates: no family available 516/10  - Inter-disciplinary family meet or Palliative Care meeting due by:  day 7   Dirk Dress, NP 02/04/2016  9:09 AM Pager: [phone] or [phone]

## 2016-02-04 NOTE — Progress Notes (Addendum)
1 Day Post-Op  Subjective: Intubated sedated on neo  Objective: Vital signs in last 24 hours: Temp:  [98.1 F (36.7 C)-99.7 F (37.6 C)] 98.1 F (36.7 C) (06/10 0800) Pulse Rate:  [73-117] 85 (06/10 0830) Resp:  [12-25] 23 (06/10 0830) BP: (95-143)/(46-65) 102/49 mmHg (06/10 0510) SpO2:  [93 %-99 %] 96 % (06/10 0830) Arterial Line BP: (86-166)/(44-79) 113/53 mmHg (06/10 0830) FiO2 (%):  [40 %] 40 % (06/10 0510) Weight:  [84.1 kg (185 lb 6.5 oz)] 84.1 kg (185 lb 6.5 oz) (06/10 0400) Last BM Date:  (PTA)  Intake/Output from previous day: 06/09 0701 - 06/10 0700 In: 4412.8 [I.V.:3932.8; NG/GT:330; IV Piggyback:150] Out: 2775 [Urine:1350; Emesis/NG output:1075; Drains:250; Blood:100] Intake/Output this shift: Total I/O In: 205 [I.V.:175; NG/GT:30] Out: 100 [Urine:100]  GI: soft wound clean approp tender  Lab Results:   Recent Labs  02/19/2016 0400 02/04/16 0410  WBC 23.9* 21.8*  HGB 10.3* 9.7*  HCT 30.2* 29.0*  PLT 89* 59*   BMET  Recent Labs  02/14/2016 0726 02/07/2016 1445  NA 133* 133*  K 4.4 4.5  CL 108 109  CO2 20* 20*  GLUCOSE 147* 157*  BUN 10 10  CREATININE 0.72 0.71  CALCIUM 7.3* 7.3*   PT/INR  Recent Labs  02/02/16 0338  LABPROT 17.6*  INR 1.43   ABG  Recent Labs  02/07/2016 0400 02/04/16 0418  PHART 7.336* 7.383  HCO3 18.5* 19.4*    Studies/Results: Dg Chest Port 1 View  02/04/2016  CLINICAL DATA:  Acute respiratory failure EXAM: PORTABLE CHEST 1 VIEW COMPARISON:  February 03, 2016 FINDINGS: Stable support apparatus. Cardiomediastinal silhouette is stable. Persistent infiltrate in medial right base and left perihilar region. No other changes. IMPRESSION: Persistent pulmonary infiltrates and stable support apparatus. Electronically Signed   By: Gerome Sam III M.D   On: 02/04/2016 07:54   Dg Chest Port 1 View  01/26/2016  CLINICAL DATA:  Ventilator dependent respiratory failure. Followup left lower lobe atelectasis and/or pneumonia. EXAM:  PORTABLE CHEST 1 VIEW COMPARISON:  02/02/2016 and earlier. FINDINGS: Endotracheal tube tip in satisfactory position below the thoracic inlet. Right subclavian central venous catheter tip projects over the mid SVC, unchanged. Nasogastric tube courses below the diaphragm into the stomach. Persistent dense consolidation in the left lower lobe. Interval worsening of airspace opacities in the right lung base. Cardiac silhouette mildly to moderately enlarged, unchanged. Pulmonary venous hypertension without overt edema currently. IMPRESSION: 1.  Support apparatus satisfactory. 2. Stable dense left lower lobe atelectasis and/or pneumonia. 3. Worsening right basilar atelectasis and/or pneumonia. Electronically Signed   By: Hulan Saas M.D.   On: 02/08/2016 07:53    Anti-infectives: Anti-infectives    Start     Dose/Rate Route Frequency Ordered Stop   01/31/16 0400  ertapenem (INVANZ) 1 g in sodium chloride 0.9 % 50 mL IVPB     1 g 100 mL/hr over 30 Minutes Intravenous Every 24 hours 01/31/16 0334     01/31/16 0100  [MAR Hold]  cefOXitin (MEFOXIN) 2 g in dextrose 5 % 50 mL IVPB     (MAR Hold since 01/31/16 0049)   2 g 100 mL/hr over 30 Minutes Intravenous To Surgery 01/31/16 0046 01/31/16 0118      Assessment/Plan: POD 3/1 SBR, aortomesenteric bypass- will put vac on wound today, hold tf for now VDRF per ccm FEN- reasonable to consider tna now dvt prophylaxis- scds ID [id number] invanz, if no other indication this could be stopped at some point soon, do not  need from general surgery standpoint    [facility] LLCWAKEFIELD,Brian Cartelli 02/04/2016

## 2016-02-05 ENCOUNTER — Inpatient Hospital Stay (HOSPITAL_COMMUNITY): Payer: BLUE CROSS/BLUE SHIELD

## 2016-02-05 DIAGNOSIS — G9340 Encephalopathy, unspecified: Secondary | ICD-10-CM

## 2016-02-05 LAB — BASIC METABOLIC PANEL
ANION GAP: 4 — AB (ref 5–15)
BUN: 19 mg/dL (ref 6–20)
CO2: 19 mmol/L — AB (ref 22–32)
Calcium: 7.1 mg/dL — ABNORMAL LOW (ref 8.9–10.3)
Chloride: 114 mmol/L — ABNORMAL HIGH (ref 101–111)
Creatinine, Ser: 0.8 mg/dL (ref 0.61–1.24)
Glucose, Bld: 146 mg/dL — ABNORMAL HIGH (ref 65–99)
POTASSIUM: 4.3 mmol/L (ref 3.5–5.1)
Sodium: 137 mmol/L (ref 135–145)

## 2016-02-05 LAB — CBC
HEMATOCRIT: 28.6 % — AB (ref 39.0–52.0)
Hemoglobin: 9.7 g/dL — ABNORMAL LOW (ref 13.0–17.0)
MCH: 29.5 pg (ref 26.0–34.0)
MCHC: 33.9 g/dL (ref 30.0–36.0)
MCV: 86.9 fL (ref 78.0–100.0)
PLATELETS: 46 10*3/uL — AB (ref 150–400)
RBC: 3.29 MIL/uL — AB (ref 4.22–5.81)
RDW: 14.6 % (ref 11.5–15.5)
WBC: 18.9 10*3/uL — AB (ref 4.0–10.5)

## 2016-02-05 LAB — AMMONIA: Ammonia: 21 umol/L (ref 9–35)

## 2016-02-05 LAB — GLUCOSE, CAPILLARY: Glucose-Capillary: 146 mg/dL — ABNORMAL HIGH (ref 65–99)

## 2016-02-05 LAB — TSH: TSH: 0.24 u[IU]/mL — ABNORMAL LOW (ref 0.350–4.500)

## 2016-02-05 MED ORDER — FUROSEMIDE 10 MG/ML IJ SOLN
40.0000 mg | Freq: Two times a day (BID) | INTRAMUSCULAR | Status: AC
  Administered 2016-02-05 (×2): 40 mg via INTRAVENOUS
  Filled 2016-02-05 (×2): qty 4

## 2016-02-05 MED ORDER — SODIUM CHLORIDE 0.9% FLUSH: 10.0000 mL | INTRAVENOUS | Status: DC | PRN

## 2016-02-05 MED ORDER — FENTANYL CITRATE (PF) 100 MCG/2ML IJ SOLN
25.0000 ug | INTRAMUSCULAR | Status: DC | PRN
  Administered 2016-02-05 – 2016-02-07 (×4): 100 ug via INTRAVENOUS
  Filled 2016-02-05 (×5): qty 2

## 2016-02-05 MED ORDER — INSULIN ASPART 100 UNIT/ML ~~LOC~~ SOLN
0.0000 [IU] | SUBCUTANEOUS | Status: DC
  Administered 2016-02-05 – 2016-02-07 (×8): 1 [IU] via SUBCUTANEOUS
  Administered 2016-02-07: 2 [IU] via SUBCUTANEOUS
  Administered 2016-02-07 – 2016-02-08 (×4): 1 [IU] via SUBCUTANEOUS
  Administered 2016-02-08 (×2): 2 [IU] via SUBCUTANEOUS

## 2016-02-05 MED ORDER — TRACE MINERALS CR-CU-MN-SE-ZN 10-1000-500-60 MCG/ML IV SOLN
INTRAVENOUS | Status: AC
  Administered 2016-02-05: 17:00:00 via INTRAVENOUS
  Filled 2016-02-05: qty 960

## 2016-02-05 NOTE — Progress Notes (Signed)
    Subjective  - POD #4  reamins intubated Not following commands   Physical Exam:  Vac in place on abdomen Feet warm       Assessment/Plan:  POD #4  Stable from vascular perspective Continue with holding sedation to better eval neuro function  Sharmila Wrobleski, Wells 02/05/2016 8:02 AM --  Filed Vitals:   02/05/16 0600 02/05/16 0700  BP: 109/78 111/80  Pulse: 68 75  Temp:    Resp: 27 28    Intake/Output Summary (Last 24 hours) at 02/05/16 0802 Last data filed at 02/05/16 0700  Gross per 24 hour  Intake 3617.14 ml  Output   2065 ml  Net 1552.14 ml     Laboratory CBC    Component Value Date/Time   WBC 18.9* 02/05/2016 0400   HGB 9.7* 02/05/2016 0400   HCT 28.6* 02/05/2016 0400   PLT 46* 02/05/2016 0400    BMET    Component Value Date/Time   NA 137 02/05/2016 0400   K 4.3 02/05/2016 0400   CL 114* 02/05/2016 0400   CO2 19* 02/05/2016 0400   GLUCOSE 146* 02/05/2016 0400   BUN 19 02/05/2016 0400   CREATININE 0.80 02/05/2016 0400   CALCIUM 7.1* 02/05/2016 0400   GFRNONAA >60 02/05/2016 0400   GFRAA >60 02/05/2016 0400    COAG Lab Results  Component Value Date   INR 1.43 02/02/2016   INR 1.29 01/29/2016   INR 1.25 01/31/2016   No results found for: PTT  Antibiotics Anti-infectives    Start     Dose/Rate Route Frequency Ordered Stop   01/31/16 0400  ertapenem (INVANZ) 1 g in sodium chloride 0.9 % 50 mL IVPB     1 g 100 mL/hr over 30 Minutes Intravenous Every 24 hours 01/31/16 0334     01/31/16 0100  [MAR Hold]  cefOXitin (MEFOXIN) 2 g in dextrose 5 % 50 mL IVPB     (MAR Hold since 01/31/16 0049)   2 g 100 mL/hr over 30 Minutes Intravenous To Surgery 01/31/16 0046 01/31/16 0118       V. Charlena CrossWells Shaterra Sanzone IV, M.D. [facility] [place] Office: [phone] Pager:  [phone]

## 2016-02-05 NOTE — Consult Note (Addendum)
Neurology Consultation Reason for Consult: Unresponsiveness Referring Physician: Kendrick FriesMcQuaid, D  CC: Unresponsiveness  History is obtained from: Referring provider, chart  HPI: Brian Baxter is a [age] y.o. male with a history of recent stroke on May 31. He was admitted with multiple embolic strokes which was worked up at [facility]. He underwent echo, TEE showing no source of emboli. These were in both the anterior and posterior circulation and in both cerebral hemispheres. They did an MRI pelvis looking for DVT that was not visible otherwise and this was negative. MRA head with only mild atherosclerotic changes of the ICAs. Carotid Dopplers with no stenosis. LDL 134  The largest was noted to be 2.4 x 4.2 cm the right occipital lobe. He then went home and [facility] with abdominal pain. 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 [facility] TPA administration, but given his recent CVA that was not possible. He was seen by Dr. early of vascular surgery who performed bowel resection with a wound VAC left in place.  Given his open abdomen, he was heavily sedated following the procedure. On lightning sedation, however he has been noted to not be waking up. Given his recent embolic infarcts, neurology has been consulted for further evaluation.   LKW: Unclear tpa given?: no, recent stroke  ROS: Unable to obtain due to altered mental status.   Past Medical History  Diagnosis Date  . Hypertension   . TIA (transient ischemic attack) june 2017  . 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 CVA (cerebral infarction)   . 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 Stroke Father      Social History:  reports that he has been smoking Cigarettes.  He has a 30 pack-year smoking history. He has never used smokeless tobacco. He reports that he drinks  alcohol. He reports that he does not use illicit drugs.   Exam: Current vital signs: BP 188/79 mmHg  Pulse 90  Temp(Src) 100.9 F (38.3 C) (Oral)  Resp 33  Ht 5\' 9"  (1.753 m)  Wt 84.6 kg (186 lb 8.2 oz)  BMI 27.53 kg/m2  SpO2 100% Vital signs in last 24 hours: Temp:  [97.9 F (36.6 C)-100.9 F (38.3 C)] 100.9 F (38.3 C) (06/11 1500) Pulse Rate:  [58-112] 90 (06/11 1717) Resp:  [20-39] 33 (06/11 1717) BP: (95-188)/(48-100) 188/79 mmHg (06/11 1717) SpO2:  [100 %] 100 % (06/11 1717) Arterial Line BP: (93-166)/(47-83) 157/77 mmHg (06/11 1600) FiO2 (%):  [40 %] 40 % (06/11 1717) Weight:  [84.6 kg (186 lb 8.2 oz)] 84.6 kg (186 lb 8.2 oz) (06/11 0200)   Physical Exam  Constitutional: Appears ill Psych: Does not interact Eyes: No scleral injection HENT: ET tube in place Head: Normocephalic.  Cardiovascular: Normal rate  Respiratory: Ventilated GI: Wound VAC in place. Skin: WDI  Neuro: Mental Status: He does not open his eyes or follow commands. Does grimace to noxious stimuli some. Cranial Nerves: II: He does not blink to threat, pupils are equal round and reactive   III,IV, VI: Difficult to test due to positioning and ET tube V: Responds to stimuli bilaterally VII: Facial movement is grossly symmetric. Though difficult to tell due to ET tube Motor: He withdraws bilateral arms. He has triple flexion of bilateral legs. Sensory: He responds to noxious stimuli in bilateral upper extremities, triple flexion of  right leg,? Withdrawal versus triple flexion of the left leg. Deep Tendon Reflexes: He has 3+ with crossed abductors at bilateral patella Thomas clonus at bilateral ankles Plantars: Triple flexion to plantar stimulation bilaterally Cerebellar: Does not perform      I have reviewed labs in epic and the results pertinent to this consultation are: DRVVT, hexagonal phase phospholipid, cardiolipin antibodies, prothrombin, factor V Leiden, homocystine, beta 2  glycoprotein, antithrombin III all are normal. Protein C and protein S are abnormal, but I am uncertain of the significance of these in the acute setting     Impression: 60 year old male with multifocal embolic cerebral infarcts in late May who then presented with splenic and abdominal infarcts with ischemic bowel. I strongly suspect that he has had further embolic ischemic events to the brain as well as spinal cord. Further imaging with MRI will help delineate this.  Recommendations: 1) MRI brain, cervical spine, thoracic spine 2) ? Need for hematology consult given massive number of apparent embolic events without clear source 3) no need to repeat full stroke workup given that it was just done 4) TSH, a.m. cortisol, ammonia  Ritta Slot, MD [facility] [phone]  If 7pm- 7am, please page neurology on call as listed in AMION.

## 2016-02-05 NOTE — Progress Notes (Signed)
PULMONARY / CRITICAL CARE MEDICINE   Name: Brian Baxter MRN: [id number] DOB: [birth year]    ADMISSION DATE:  02/11/2016 CONSULTATION DATE:  01/31/2016  REFERRING MD:  Dr. Arbie Cookey  CHIEF COMPLAINT:  Abdominal pain  HISTORY OF PRESENT ILLNESS:   60 year old male with hx HTN, tobacco abuse admitted 02/18/2016 in the setting of small bowel ischemia and possible right kidney ischemia. He was taken urgently to OR with gen surgery and vascular, abd left open with wound vac.  Tx to ICU intubated, on pressors.    Previous admission to outside hospital 5/31 where MRI brain noted multiple strokes.  During that admit he had extensive w/u to look for etiology of clot which was ess negative including neg TEE.    SUBJECTIVE:  Worseing bilateral pulm infiltrates/edema Fentanyl gtt off, remains on low dose precedex.  Gets easily tachypneic, slightly agitated but does not open eyes or follow commands.   VITAL SIGNS: BP 111/80 mmHg  Pulse 75  Temp(Src) 99.3 F (37.4 C) (Axillary)  Resp 28  Ht  (1.753 m)  Wt 84.6 kg (186 lb 8.2 oz)  BMI 27.53 kg/m2  SpO2 100%  HEMODYNAMICS:    VENTILATOR SETTINGS: Vent Mode:  [-] PRVC FiO2 (%):  [40 %] 40 % Set Rate:  [14 bmp] 14 bmp Vt Set:  [570 mL] 570 mL PEEP:  [5 cmH20] 5 cmH20 Plateau Pressure:  [18 cmH20-22 cmH20] 20 cmH20  INTAKE / OUTPUT: I/O last 3 completed shifts: In: 6078.1 [I.V.:5318.1; NG/GT:510; IV Piggyback:250] Out: 3265 [Urine:1665; Emesis/NG output:1600]  PHYSICAL EXAMINATION: General:  Sedated on vent, NAD  Neuro:  RASS -1, tachypneic with stimulation, does not open eyes, does not follow commands  HEENT:  Normocephalic/atraumatic, endotracheal tube in place Cardiovascular:  Regular rate and rhythm, no murmurs gallops rubs Lungs:  resps even non labored on vent, bibasilar crackles, exp wheeze  Abdomen:  No bowel sounds, abd incision with VAC  Musculoskeletal:  Normal bulk and tone  LABS:  BMET  Recent Labs Lab  02/07/2016 0726 02/14/2016 1445 02/05/16 0400  NA 133* 133* 137  K 4.4 4.5 4.3  CL 108 109 114*  CO2 20* 20* 19*  BUN CREATININE 0.72 0.71 0.80  GLUCOSE 147* 157* 146*    Electrolytes  Recent Labs Lab 02/18/2016 0400 02/02/16 0338  02/20/2016 0726 02/06/2016 1445 02/04/16 0410 02/05/16 0400  CALCIUM 8.1* 6.8*  < > 7.3* 7.3*  --  7.1*  MG 2.1 1.8  --   --   --  1.9  --   PHOS 2.6 2.5  < > 2.6 3.0 2.9  --   < > = values in this interval not displayed.  CBC  Recent Labs Lab 02/23/2016 0400 02/04/16 0410 02/05/16 0400  WBC 23.9* 21.8* 18.9*  HGB 10.3* 9.7* 9.7*  HCT 30.2* 29.0* 28.6*  PLT 89* 59* 46*    Coag's  Recent Labs Lab 01/31/16 0230 01/31/16 0505 02/19/2016 0400 02/02/16 0338  APTT 32  --  39* 35  INR 1.18 1.25 1.29 1.43    Sepsis Markers  Recent Labs Lab 02/21/2016 2207 01/31/16 0230  LATICACIDVEN 0.8 0.9    ABG  Recent Labs Lab 01/27/2016 0327 02/05/2016 0400 02/04/16 0418  PHART 7.357 7.336* 7.383  PCO2ART 34.5* 34.7* 33.2*  PO2ART 75.9* 83.0 81.5    Liver Enzymes  Recent Labs Lab 02/11/2016 2207 01/31/16 0505 02/02/16 0430 02/01/2016 0726 02/19/2016 1445  AST 28 25  --   --   --  ALT 34 32  --   --   --   ALKPHOS 70 59  --   --   --   BILITOT 0.9 0.5  --   --   --   ALBUMIN 2.9* 2.5* 1.3* 1.2* 1.2*    Cardiac Enzymes No results for input(s): TROPONINI, PROBNP in the last 168 hours.  Glucose  Recent Labs Lab 02/16/2016 1628 02/04/16 1603  GLUCAP 139* 122*    Imaging No results found.   STUDIES:  01/26/2016 outside hospital TTE: Left atrial enlargement, contrast suggestive of patent foramen ovale 01/26/2016 bilateral carotid ultrasound: No significant stenosis 01/26/2016 ultrasound Doppler legs>  negative for  DVT 01/27/2016 MR angiogram pelvis: Mild compression of the left common iliac vein without significant narrowing or thrombosis 01/27/2016 outside hospital TTE> Normal valves, left atrial enlargement, there is no  abnormal contrast to suggest intra-atrial shunting 6/5 CT abdomen OSH> CT angiogram findings worrisome for superior mesenteric artery clot, small bowel ischemia, infarcts to kidney and spleen.  CULTURES: None  ANTIBIOTICS: 01/31/2016 ertapenem >>   SIGNIFICANT EVENTS: 6/5 CTA abd >>>worrisome for superior mesenteric artery clot 6/5 OR >> small bowel resection, aortomesenteric bypass  6/9 back to OR>> abd closure   LINES/TUBES: 01/27/2016 endotracheal tube  DISCUSSION: This is a 60 year old male who has small bowel ischemia, possible renal infarct in the setting of the superior mesenteric artery clot. This occurs several days after recent hospitalization for stroke thought to be embolic.  Uncertain etiology of hypercoagulable state.  Remains critically ill, s/p surgical repair.     ASSESSMENT / PLAN:  PULMONARY A: Mechanical ventilatory support postoperative History of tobacco use ? 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 Shock postoperatively, likely sedation related Volume overload 6/11 P:  Vascular surgery following Hold anticoagulation given recent large ischemic stroke Hypercoag workup pending - follow up with hematology @ some point  Continue aspirin if okay by vascular surgery and general surgery Neurology consult  Consider repeat TEE or TTE Lasix 40mg  IV BID x 2 doses 6/11 Check CVP   RENAL A:   Concern for partial right renal infarct on CT angiogram, normal renal function P:   Monitor BMET and UOP Replace electrolytes as needed Lasix as above    GASTROINTESTINAL A:   Small bowel ischemia secondary to superior mesenteric artery clot Post laparotomy with open abd P:   Postoperative care per general surgery Nothing by mouth PPI Will add TPN per surgery recs   HEMATOLOGIC A:    Concern for hypercoagulable state Stents of the workup for embolic cause of multiple arterial strokes negative at outside hospital 3-4 days prior to admission P:  Hypercoagulability panel > AT III level normal, rest is pending Hold heparin until okay by neurology, presumably June 13 (14 days post stroke)  INFECTIOUS A:   Peritonitis P:   Ertapenem 6/6>>>  ENDOCRINE A:   No acute  issues P:   Monitor glucose  NEUROLOGIC A:   Recent stroke P:   RASS goal: -1 Lighten sedation  precedex  Fentanyl PRN  Versed prn   FAMILY  - Updates: no family available 6/11  - Inter-disciplinary family meet or Palliative Care meeting due by:  day 7   Dirk DressKaty Whiteheart, NP 02/05/2016  8:39 AM Pager: [phone] or [phone]

## 2016-02-05 NOTE — Progress Notes (Signed)
2 Days Post-Op  Subjective:  On vent Objective: Vital signs in last 24 hours: Temp:  [97.9 F (36.6 C)-99.8 F (37.7 C)] 98.3 F (36.8 C) (06/11 0415) Pulse Rate:  [58-112] 75 (06/11 0700) Resp:  [20-34] 28 (06/11 0700) BP: (95-155)/(48-100) 111/80 mmHg (06/11 0700) SpO2:  [95 %-100 %] 100 % (06/11 0700) Arterial Line BP: (93-169)/(46-83) 107/51 mmHg (06/11 0700) FiO2 (%):  [40 %] 40 % (06/11 0430) Weight:  [84.6 kg (186 lb 8.2 oz)] 84.6 kg (186 lb 8.2 oz) (06/11 0200) Last BM Date:  (PTA)  Intake/Output from previous day: 06/10 0701 - 06/11 0700 In: 3822.1 [I.V.:3342.1; NG/GT:330; IV Piggyback:150] Out: 2165 [Urine:1065; Emesis/NG output:1100] Intake/Output this shift:    General appearance: no distress GI: soft, a few BS, wound VAC  Lab Results:   Recent Labs  02/04/16 0410 02/05/16 0400  WBC 21.8* 18.9*  HGB 9.7* 9.7*  HCT 29.0* 28.6*  PLT 59* 46*   BMET  Recent Labs  02/22/2016 1445 02/05/16 0400  NA 133* 137  K 4.5 4.3  CL 109 114*  CO2 20* 19*  GLUCOSE 157* 146*  BUN 10 19  CREATININE 0.71 0.80  CALCIUM 7.3* 7.1*   PT/INR No results for input(s): LABPROT, INR in the last 72 hours. ABG  Recent Labs  02/19/2016 0400 02/04/16 0418  PHART 7.336* 7.383  HCO3 18.5* 19.4*    Studies/Results: Dg Chest Port 1 View  02/04/2016  CLINICAL DATA:  Acute respiratory failure EXAM: PORTABLE CHEST 1 VIEW COMPARISON:  February 03, 2016 FINDINGS: Stable support apparatus. Cardiomediastinal silhouette is stable. Persistent infiltrate in medial right base and left perihilar region. No other changes. IMPRESSION: Persistent pulmonary infiltrates and stable support apparatus. Electronically Signed   By: Gerome Samavid  Williams III M.D   On: 02/04/2016 07:54    Anti-infectives: Anti-infectives    Start     Dose/Rate Route Frequency Ordered Stop   01/31/16 0400  ertapenem (INVANZ) 1 g in sodium chloride 0.9 % 50 mL IVPB     1 g 100 mL/hr over 30 Minutes Intravenous Every 24  hours 01/31/16 0334     01/31/16 0100  [MAR Hold]  cefOXitin (MEFOXIN) 2 g in dextrose 5 % 50 mL IVPB     (MAR Hold since 01/31/16 0049)   2 g 100 mL/hr over 30 Minutes Intravenous To Surgery 01/31/16 0046 01/31/16 0118      Assessment/Plan: s/p Procedure(s): EXPLORATORY LAPAROTOMY (N/A) SMALL BOWEL RESECTION (N/A) POD 6/4/2 SBR, aortomesenteric bypass- will put vac on wound today, await bowel function for tube feeds VDRF per ccm FEN- reasonable to consider tna now dvt prophylaxis- scds ID [id number] invanz, stop tomorrow if doing qwell   LOS: 6 days    Brian Baxter E 02/05/2016

## 2016-02-05 NOTE — Progress Notes (Signed)
PARENTERAL NUTRITION CONSULT NOTE - INITIAL  Pharmacy Consult for TPN Indication: Massive Bowel Resection   Allergies  Allergen Reactions  . Atorvastatin Other (See Comments)    Myalgias (intolerance)  . Penicillins Hives and Rash  . Simvastatin Rash    Patient Measurements: Height:  (175.3 cm) Weight: 186 lb 8.2 oz (84.6 kg) IBW/kg (Calculated) : 70.7   Vital Signs: Temp: 99.3 F (37.4 C) (06/11 0755) Temp Source: Axillary (06/11 0755) BP: 111/80 mmHg (06/11 0700) Pulse Rate: 75 (06/11 0700) Intake/Output from previous day: 06/10 0701 - 06/11 0700 In: 3822.1 [I.V.:3342.1; NG/GT:330; IV Piggyback:150] Out: 2165 [Urine:1065; Emesis/NG output:1100] Intake/Output from this shift: Total I/O In: 155 [I.V.:125; NG/GT:30] Out: 60 [Urine:60]  Labs:  Recent Labs  01/26/2016 0400 02/04/16 0410 02/05/16 0400  WBC 23.9* 21.8* 18.9*  HGB 10.3* 9.7* 9.7*  HCT 30.2* 29.0* 28.6*  PLT 89* 59* 46*     Recent Labs  02/04/2016 0726 02/07/2016 1445 02/04/16 0410 02/05/16 0400  NA 133* 133*  --  137  K 4.4 4.5  --  4.3  CL 108 109  --  114*  CO2 20* 20*  --  19*  GLUCOSE 147* 157*  --  146*  BUN 10 10  --  19  CREATININE 0.72 0.71  --  0.80  CALCIUM 7.3* 7.3*  --  7.1*  MG  --   --  1.9  --   PHOS 2.6 3.0 2.9  --   ALBUMIN 1.2* 1.2*  --   --    Estimated Creatinine Clearance: 99.4 mL/min (by C-G formula based on Cr of 0.8).    Recent Labs  01/31/2016 1628 02/04/16 1603  GLUCAP 139* 122*    Medical History: Past Medical History  Diagnosis Date  . Hypertension   . TIA (transient ischemic attack) june 2017  . Renal infarct (HCC) 02/16/2016  . CVA (cerebral infarction)   . 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 aspirin 81 MG tablet Take 81 mg by mouth daily.     Marland Kitchen atorvastatin (LIPITOR) 80 MG tablet Take 80 mg by mouth daily.     . meclizine  (ANTIVERT) 32 MG tablet Take 32 mg by mouth 3 (three) times daily as needed.     . Olmesartan-Amlodipine-HCTZ (TRIBENZOR) 40-10-12.5 MG TABS Take by mouth every morning.     . pantoprazole (PROTONIX) 40 MG tablet Take 40 mg by mouth daily.       Insulin Requirements in the past 24 hours:  Not on insulin   Assessment: 36 YOM who was admitted with small bowel ischemia (secondary to superior mesenteric artery clot) was taken urgently to OR and underwent exp lap and small bowel resection on 6/9. Pharmacy consulted to start TPN   Surgeries/Procedures: 6/9 Exp Lap & SBR   GI: POD #2. NPO.  Endo: Glucose controlled  Lytes: K 4.3, Mg 1.9, Phos 2.9 Renal: SCr 0.8, UOP 0.5, +11L since admission  Pulm: Intubated  Cards: HTN, VSS  Hepatobil: AST/ALT and bili wnl on 6/5 Neuro: On Precedex and fentanyl drip, RASS -1, GCS 5, CPOT 0  ID: WBC 18.9, on D#6 of ertapenem  Best Practices: Pepcid, MC  TPN Access: CVC triple lumen 6/5>>  TPN start date: 6/11>>   Nutritional Goals:  Per RD recommendations   Current Nutrition:  NPO    Plan:  -Start Clinimix E 5/15 at 40 mL/hr. Hold  lipids for 7 days since patient is critically ill in the ICU. Day 1/7 of holding lipids. Advance TPN as tolerated -Add MVI and TE to TPN -Add Pepcid 40 mg to TPN and d/c Pepcid order  -F/u RD recommendation for nutritional goals  -Start sensitive SSI -F/u TPN labs   [name], PharmD., BCPS Clinical Pharmacist Pager [phone]

## 2016-02-05 NOTE — Progress Notes (Signed)
Clarified with Dr. Janee Mornhompson, ok'd clamping wound vac while pt in MRI

## 2016-02-06 ENCOUNTER — Inpatient Hospital Stay (HOSPITAL_COMMUNITY): Payer: BLUE CROSS/BLUE SHIELD

## 2016-02-06 ENCOUNTER — Encounter (HOSPITAL_COMMUNITY): Payer: Self-pay | Admitting: Surgery

## 2016-02-06 DIAGNOSIS — E43 Unspecified severe protein-calorie malnutrition: Secondary | ICD-10-CM

## 2016-02-06 DIAGNOSIS — R601 Generalized edema: Secondary | ICD-10-CM

## 2016-02-06 DIAGNOSIS — R4189 Other symptoms and signs involving cognitive functions and awareness: Secondary | ICD-10-CM | POA: Insufficient documentation

## 2016-02-06 DIAGNOSIS — I63031 Cerebral infarction due to thrombosis of right carotid artery: Secondary | ICD-10-CM

## 2016-02-06 DIAGNOSIS — R404 Transient alteration of awareness: Secondary | ICD-10-CM

## 2016-02-06 DIAGNOSIS — R748 Abnormal levels of other serum enzymes: Secondary | ICD-10-CM

## 2016-02-06 LAB — GLUCOSE, CAPILLARY
GLUCOSE-CAPILLARY: 131 mg/dL — AB (ref 65–99)
GLUCOSE-CAPILLARY: 138 mg/dL — AB (ref 65–99)
GLUCOSE-CAPILLARY: 114 mg/dL — AB (ref 65–99)
Glucose-Capillary: 141 mg/dL — ABNORMAL HIGH (ref 65–99)
Glucose-Capillary: 126 mg/dL — ABNORMAL HIGH (ref 65–99)
Glucose-Capillary: 147 mg/dL — ABNORMAL HIGH (ref 65–99)

## 2016-02-06 LAB — COMPREHENSIVE METABOLIC PANEL
ALT: 76 U/L — ABNORMAL HIGH (ref 17–63)
ANION GAP: 8 (ref 5–15)
AST: 135 U/L — ABNORMAL HIGH (ref 15–41)
Albumin: 1.1 g/dL — ABNORMAL LOW (ref 3.5–5.0)
Alkaline Phosphatase: 79 U/L (ref 38–126)
BUN: 32 mg/dL — ABNORMAL HIGH (ref 6–20)
CHLORIDE: 110 mmol/L (ref 101–111)
CO2: 20 mmol/L — ABNORMAL LOW (ref 22–32)
CREATININE: 1.04 mg/dL (ref 0.61–1.24)
Calcium: 7.1 mg/dL — ABNORMAL LOW (ref 8.9–10.3)
Glucose, Bld: 132 mg/dL — ABNORMAL HIGH (ref 65–99)
POTASSIUM: 3.8 mmol/L (ref 3.5–5.1)
Sodium: 138 mmol/L (ref 135–145)
Total Bilirubin: 3.5 mg/dL — ABNORMAL HIGH (ref 0.3–1.2)
Total Protein: 4.3 g/dL — ABNORMAL LOW (ref 6.5–8.1)

## 2016-02-06 LAB — JAK2 GENOTYPR

## 2016-02-06 LAB — POCT I-STAT 3, ART BLOOD GAS (G3+)
Bicarbonate: 23.4 mEq/L (ref 20.0–24.0)
O2 SAT: 98 %
TCO2: 24 mmol/L (ref 0–100)
pCO2 arterial: 30 mmHg — ABNORMAL LOW (ref 35.0–45.0)
pH, Arterial: 7.499 — ABNORMAL HIGH (ref 7.350–7.450)
pO2, Arterial: 89 mmHg (ref 80.0–100.0)

## 2016-02-06 LAB — CBC
HCT: 26.2 % — ABNORMAL LOW (ref 39.0–52.0)
Hemoglobin: 9 g/dL — ABNORMAL LOW (ref 13.0–17.0)
MCH: 29.9 pg (ref 26.0–34.0)
MCHC: 34.4 g/dL (ref 30.0–36.0)
MCV: 87 fL (ref 78.0–100.0)
PLATELETS: 42 10*3/uL — AB (ref 150–400)
RBC: 3.01 MIL/uL — AB (ref 4.22–5.81)
RDW: 14.8 % (ref 11.5–15.5)
WBC: 19.6 10*3/uL — AB (ref 4.0–10.5)

## 2016-02-06 LAB — FACTOR 5 LEIDEN

## 2016-02-06 LAB — TROPONIN I: Troponin I: 3.41 ng/mL (ref <0.031)

## 2016-02-06 LAB — DIFFERENTIAL
BASOS ABS: 0 10*3/uL (ref 0.0–0.1)
BASOS PCT: 0 %
Eosinophils Absolute: 0 10*3/uL (ref 0.0–0.7)
Eosinophils Relative: 0 %
Lymphocytes Relative: 6 %
Lymphs Abs: 1.2 10*3/uL (ref 0.7–4.0)
MONO ABS: 2.2 10*3/uL — AB (ref 0.1–1.0)
Monocytes Relative: 11 %
NEUTROS PCT: 83 %
Neutro Abs: 16.2 10*3/uL — ABNORMAL HIGH (ref 1.7–7.7)

## 2016-02-06 LAB — CORTISOL-AM, BLOOD: Cortisol - AM: 27.2 ug/dL — ABNORMAL HIGH (ref 6.7–22.6)

## 2016-02-06 LAB — PHOSPHORUS: PHOSPHORUS: 5.8 mg/dL — AB (ref 2.5–4.6)

## 2016-02-06 LAB — PREALBUMIN: PREALBUMIN: 5 mg/dL — AB (ref 18–38)

## 2016-02-06 LAB — TRIGLYCERIDES: Triglycerides: 305 mg/dL — ABNORMAL HIGH (ref <150)

## 2016-02-06 LAB — MAGNESIUM: MAGNESIUM: 2.3 mg/dL (ref 1.7–2.4)

## 2016-02-06 IMAGING — CT CT ANGIO NECK
2 of 18 series · 4 of 35 positions shown · IV contrast (Iohexol (Omnipaque 350))
Comparison: MRI head [DATE]

CLINICAL DATA: Stroke.

EXAM:
CT ANGIOGRAPHY HEAD AND NECK
TECHNIQUE: Multidetector CT imaging of the head and neck was performed using
the standard protocol during bolus administration of intravenous
contrast. Multiplanar CT image reconstructions and MIPs were
obtained to evaluate the vascular anatomy. Carotid stenosis
measurements (when applicable) are obtained utilizing NASCET
criteria, using the distal internal carotid diameter as the
denominator.
CONTRAST:  50 mL Isovue 370 IV

[Series 509: axial thin · axial · 0.43mm/px · z∈[+972,+1268]mm · 3 of 298 slices shown]
[im 1/298  soft-tissue]
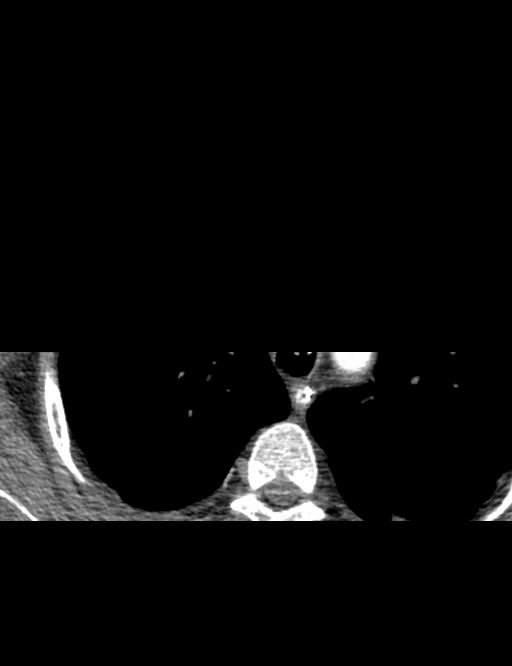
[im 149/298  bone]
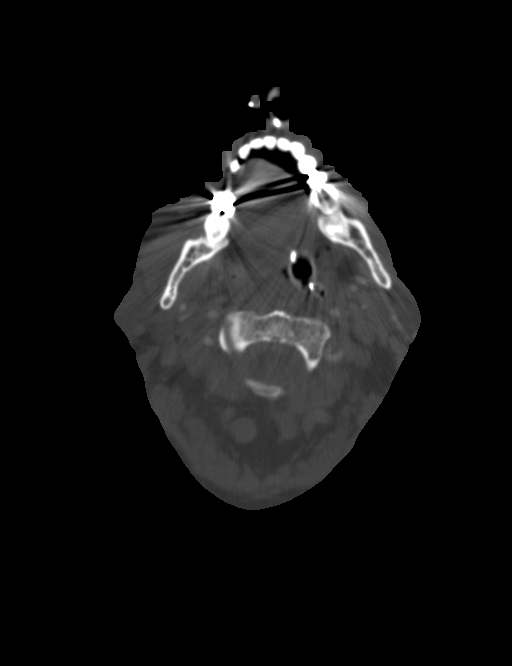
[im 298/298  soft-tissue]
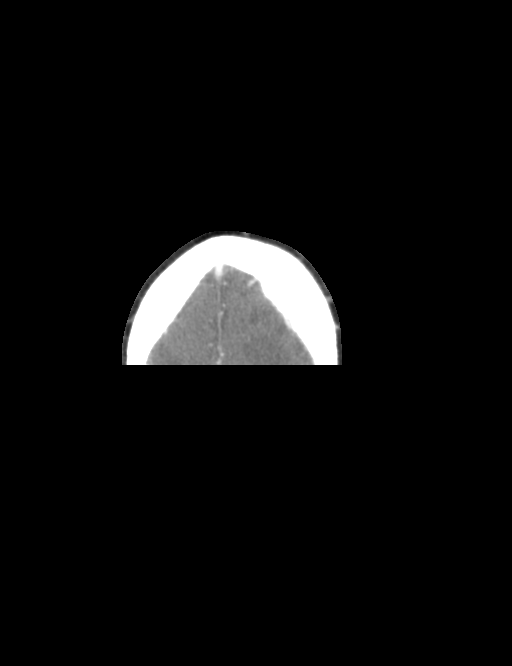

[Series 5011: sagittal thin · sagittal · 0.43mm/px · 1 of 181 slices shown]
[im 37/181  soft-tissue]
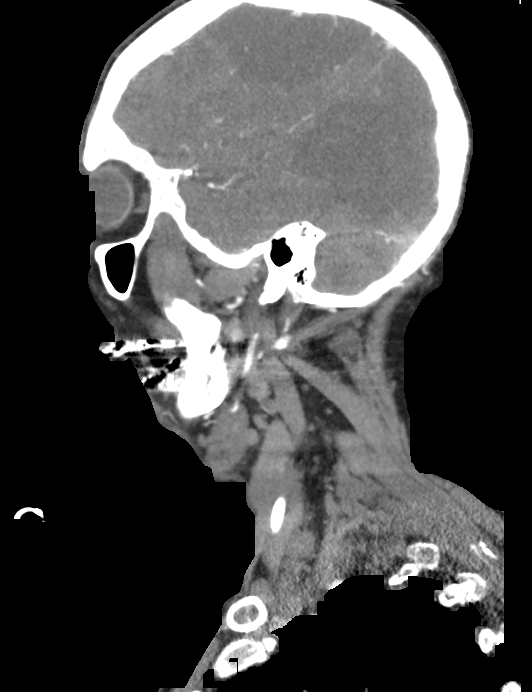

[4 of 35 positions shown; findings below may reference images not displayed]

FINDINGS: CT HEAD

Brain: Large territory subacute infarct in the right MCA territory
similar to the prior MRI. This extends into the right occipital lobe
in the right PCA territory. Subacute infarct in the left MCA
territory over the convexity. This may be a watershed infarct.
Subacute infarct left occipital lobe. Multiple areas of subacute
infarction in the cerebellum bilaterally. Subacute infarct left
caudate. These are all similar to the recent MRI. No associated
acute hemorrhage. No shift of the midline structures

Calvarium and skull base: Negative

Paranasal sinuses: Clear. The patient is intubated. NG tube in the
esophagus.

Orbits: No orbital lesion.

CTA NECK

Aortic arch: Aortic arch not imaged. Proximal great vessels are
patent with diffuse atherosclerotic calcification. Ground-glass
airspace disease bilaterally likely due to mild edema.

Right carotid system: Right common carotid artery widely patent.
Atherosclerotic calcification of the right carotid bifurcation. 50%
diameter stenosis proximal right internal carotid artery. Right
external carotid artery widely patent.

Left carotid system: Left common carotid artery widely patent.
Atherosclerotic calcification left carotid bifurcation. 75% stenosis
proximal left internal carotid artery. Mild stenosis external
carotid artery on the left.

Vertebral arteries:Atherosclerotic calcification is seen scattered
throughout both vertebral arteries. Moderately severe calcific
stenosis origin right vertebral artery. Mild stenosis origin left
vertebral artery. Moderate stenosis distal left vertebral artery due
to calcific stenosis.

Skeleton: Negative

Other neck: Negative for mass or adenopathy in the neck.

CTA HEAD

Anterior circulation: Extensive atherosclerotic calcification
throughout the cavernous carotid artery bilaterally with moderate to
severe stenosis bilaterally in the cavernous carotid. Anterior and
middle cerebral arteries patent bilaterally. Decreased flow in right
middle cerebral artery branches due to subacute infarction. No
filling defect. No large vessel occlusion.

Posterior circulation: Moderate stenosis distal left vertebral
artery. PICA patent bilaterally. Basilar patent. Superior cerebellar
and posterior cerebral arteries patent bilaterally without stenosis.

Venous sinuses: Patent

Anatomic variants: Negative for cerebral aneurysm.

Delayed phase: Normal enhancement postcontrast administration.
IMPRESSION: Multiple areas of subacute infarct in both cerebral hemispheres and
both cerebellar hemispheres. These are similar in distribution to
the recent MRI yesterday. No acute hemorrhage

50% diameter stenosis proximal right internal carotid artery due to
calcific stenosis. Moderate to severe stenosis in the right
cavernous carotid due to calcific stenosis

75% diameter stenosis proximal left internal carotid artery due to
calcific stenosis. Moderate to severe stenosis left cavernous
carotid due to calcific stenosis

Moderate stenosis at the origin of the right vertebral artery.
Moderate stenosis distal left vertebral artery. Scattered
atherosclerotic disease throughout both vertebral arteries.

No large vessel intracranial occlusion. Decrease flow in the right
middle cerebral artery branches due to large territory subacute
infarction.

## 2016-02-06 MED ORDER — TRACE MINERALS CR-CU-MN-SE-ZN 10-1000-500-60 MCG/ML IV SOLN
INTRAVENOUS | Status: AC
  Administered 2016-02-06: 18:00:00 via INTRAVENOUS
  Filled 2016-02-06: qty 1440

## 2016-02-06 MED ORDER — IOPAMIDOL (ISOVUE-370) INJECTION 76%
INTRAVENOUS | Status: AC
  Administered 2016-02-06: 50 mL
  Filled 2016-02-06: qty 50

## 2016-02-06 MED ORDER — METOLAZONE 5 MG PO TABS
5.0000 mg | ORAL_TABLET | Freq: Every day | ORAL | Status: AC
  Administered 2016-02-06: 5 mg via ORAL
  Filled 2016-02-06: qty 1

## 2016-02-06 MED ORDER — DEXTROSE 5 % IV SOLN
8.0000 mg/h | INTRAVENOUS | Status: AC
  Administered 2016-02-06: 8 mg/h via INTRAVENOUS
  Filled 2016-02-06: qty 25

## 2016-02-06 MED ORDER — VANCOMYCIN HCL IN DEXTROSE 1-5 GM/200ML-% IV SOLN
1000.0000 mg | Freq: Two times a day (BID) | INTRAVENOUS | Status: DC
  Administered 2016-02-06 – 2016-02-08 (×4): 1000 mg via INTRAVENOUS
  Filled 2016-02-06 (×5): qty 200

## 2016-02-06 MED ORDER — SODIUM CHLORIDE 0.9 % IV SOLN: INTRAVENOUS | Status: DC

## 2016-02-06 MED ORDER — DOCUSATE SODIUM 50 MG/5ML PO LIQD
100.0000 mg | Freq: Every day | ORAL | Status: DC
  Administered 2016-02-06 – 2016-02-08 (×3): 100 mg via ORAL
  Filled 2016-02-06 (×3): qty 10

## 2016-02-06 MED ORDER — SODIUM CHLORIDE 0.9 % IV BOLUS (SEPSIS)
1000.0000 mL | Freq: Once | INTRAVENOUS | Status: AC
  Administered 2016-02-06: 1000 mL via INTRAVENOUS

## 2016-02-06 MED ORDER — CHLORHEXIDINE GLUCONATE 0.12% ORAL RINSE (MEDLINE KIT)
15.0000 mL | Freq: Two times a day (BID) | OROMUCOSAL | Status: DC
Start: 2016-02-06 — End: 2016-02-09
  Administered 2016-02-07 – 2016-02-08 (×3): 15 mL via OROMUCOSAL

## 2016-02-06 MED ORDER — ANTISEPTIC ORAL RINSE SOLUTION (CORINZ)
7.0000 mL | OROMUCOSAL | Status: DC
  Administered 2016-02-06 – 2016-02-08 (×16): 7 mL via OROMUCOSAL

## 2016-02-06 NOTE — Progress Notes (Signed)
PULMONARY / CRITICAL CARE MEDICINE   Name: Brian Baxter MRN: [id number] DOB: 27-Jan-1956    ADMISSION DATE:  02/15/2016 CONSULTATION DATE:  01/31/2016  REFERRING MD:  Dr. Arbie Cookey  CHIEF COMPLAINT:  Abdominal pain  HISTORY OF PRESENT ILLNESS:   60 year old male with hx HTN, tobacco abuse admitted 02/14/2016 in the setting of small bowel ischemia and possible right kidney ischemia. He was taken urgently to OR with gen surgery and vascular, abd left open with wound vac.  Tx to ICU intubated, on pressors.    Previous admission to outside hospital 5/31 where MRI brain noted multiple strokes.  During that admit he had extensive w/u to look for etiology of clot which was ess negative including neg TEE.    SUBJECTIVE:  Febrile overnight, unresponsive.  VITAL SIGNS: BP 159/136 mmHg  Pulse 84  Temp(Src) 102.5 F (39.2 C) (Axillary)  Resp 25  Ht  (1.753 m)  Wt 80 kg (176 lb 5.9 oz)  BMI 26.03 kg/m2  SpO2 100%  HEMODYNAMICS: CVP:  [5 mmHg-25 mmHg] 10 mmHg  VENTILATOR SETTINGS: Vent Mode:  [-] PRVC FiO2 (%):  [40 %] 40 % Set Rate:  [14 bmp-17 bmp] 14 bmp Vt Set:  [570 mL] 570 mL PEEP:  [5 cmH20] 5 cmH20 Pressure Support:  [10 cmH20] 10 cmH20 Plateau Pressure:  [18 cmH20-28 cmH20] 18 cmH20  INTAKE / OUTPUT: I/O last 3 completed shifts: In: 3365.5 [I.V.:2274.8; NG/GT:390; IV Piggyback:200] Out: 6060 [Urine:3560; Emesis/NG output:2450; Drains:50]  PHYSICAL EXAMINATION: General:  On vent, NAD, unresponsive. Neuro:  RASS -1, does not open eyes, does not follow commands  HEENT:  Normocephalic/atraumatic, endotracheal tube in place Cardiovascular:  Regular rate and rhythm, no murmurs gallops rubs Lungs:  Resps even non labored on vent, bibasilar crackles, exp wheeze  Abdomen:  No bowel sounds, abd incision with VAC  Musculoskeletal:  Normal bulk and tone  LABS:  BMET  Recent Labs Lab 02/19/2016 1445 02/05/16 0400 02/06/16 0418  NA 133* 137 138  K 4.5 4.3 3.8  CL 109  114* 110  CO2 20* 19* 20*  BUN 10 19 32*  CREATININE 0.71 0.80 1.04  GLUCOSE 157* 146* 132*   Electrolytes  Recent Labs Lab 02/02/16 0338  02/24/2016 1445 02/04/16 0410 02/05/16 0400 02/06/16 0418  CALCIUM 6.8*  < > 7.3*  --  7.1* 7.1*  MG 1.8  --   --  1.9  --  2.3  PHOS 2.5  < > 3.0 2.9  --  5.8*  < > = values in this interval not displayed.  CBC  Recent Labs Lab 02/04/16 0410 02/05/16 0400 02/06/16 0418  WBC 21.8* 18.9* 19.6*  HGB 9.7* 9.7* 9.0*  HCT 29.0* 28.6* 26.2*  PLT 59* 46* 42*    Coag's  Recent Labs Lab 01/31/16 0230 01/31/16 0505 02/24/2016 0400 02/02/16 0338  APTT 32  --  39* 35  INR 1.18 1.25 1.29 1.43   Sepsis Markers  Recent Labs Lab 02/12/2016 2207 01/31/16 0230  LATICACIDVEN 0.8 0.9   ABG  Recent Labs Lab 02/08/2016 0400 02/04/16 0418 02/06/16 0406  PHART 7.336* 7.383 7.499*  PCO2ART 34.7* 33.2* 30.0*  PO2ART 83.0 81.5 89.0   Liver Enzymes  Recent Labs Lab 01/28/2016 2207 01/31/16 0505  02/15/2016 0726 02/15/2016 1445 02/06/16 0418  AST 28 25  --   --   --  135*  ALT 34 32  --   --   --  76*  ALKPHOS 70 59  --   --   --  79  BILITOT 0.9 0.5  --   --   --  3.5*  ALBUMIN 2.9* 2.5*  < > 1.2* 1.2* 1.1*  < > = values in this interval not displayed.  Cardiac Enzymes  Recent Labs Lab 02/06/16 1130  TROPONINI 3.41*   Glucose  Recent Labs Lab 02/04/16 1603 02/05/16 2021 02/06/16 0120 02/06/16 0345 02/06/16 0853 02/06/16 1304  GLUCAP 122* 146* 131* 138* 141* 114*   Imaging Mr Brain Wo Contrast  02/05/2016  CLINICAL DATA:  Multifocal embolic infarcts, splenic and bowel infarcts. Assess for further embolic ischemia. Worsening leg weakness. EXAM: MRI HEAD WITHOUT CONTRAST TECHNIQUE: Multiplanar, multiecho pulse sequences of the brain and surrounding structures were obtained without intravenous contrast. COMPARISON:  CT HEAD Jan 25, 2016 FINDINGS: INTRACRANIAL CONTENTS: Patchy reduced diffusion in bilateral cerebellum, vermis.  Confluent reduced diffusion within frontal, parietal, occipital lobes. Extension of reduced diffusion to RIGHT posterior temporal lobe. Patchy reduced diffusion splenium of the corpus callosum. Patchy reduced diffuse in LEFT posterior temporal lobe. Patchy reduced diffusion LEFT basal ganglia. All areas of reduced diffusion demonstrate low ADC values. Patchy areas of susceptibility artifact within areas of infarct associated with T1 shortening. Local sulcal effacement. 3 mm RIGHT to LEFT midline shift with mass effect on RIGHT lateral ventricle atrium. No ventricular entrapment or hydrocephalus. No abnormal extra-axial fluid collections. Major intracranial vascular flow voids present at skull base. ORBITS: Trace RIGHT maxillary sinus mucosal thickening. Small bilateral mastoid effusions. SINUSES: The mastoid air-cells and included paranasal sinuses are well-aerated. SKULL/SOFT TISSUES: No abnormal sellar expansion. No suspicious calvarial bone marrow signal. Craniocervical junction maintained. IMPRESSION: Extensive supra and infratentorial acute infarcts, including RIGHT greater than LEFT MCA and PCA territories consistent with embolic phenomena. Petechial hemorrhage without frank hemorrhagic conversion. 3 mm RIGHT to LEFT midline shift without ventricular entrapment. These results will be called to the ordering clinician or representative by the Radiologist Assistant, and communication documented in the PACS or zVision Dashboard. Electronically Signed   By: Awilda Metro M.D.   On: 02/05/2016 23:24   Mr Cervical Spine Wo Contrast  02/06/2016  CLINICAL DATA:  Extensive cerebral and abdominal infarcts including ischemic bowel. Leg weakness. EXAM: MRI CERVICAL, THORACIC SPINE WITHOUT CONTRAST TECHNIQUE: Multiplanar and multiecho pulse sequences of the cervical spine, to include the craniocervical junction and cervicothoracic junction, and thoracic spine, were obtained without intravenous contrast. COMPARISON:   None. FINDINGS: MRI CERVICAL SPINE FINDINGS ALIGNMENT: Straightened cervical lordosis.  No malalignment. VERTEBRAE/DISCS: Vertebral bodies are intact. Intervertebral disc morphology's and signal are normal. CORD:Cervical spinal cord is normal morphology and signal characteristics from the cervicomedullary junction to level of T1-2, the most caudal well visualized level. POSTERIOR FOSSA, VERTEBRAL ARTERIES, PARASPINAL TISSUES: No MR findings of ligamentous injury. Vertebral artery flow voids present. Included view of the posterior fossa demonstrates multiple cerebellar infarcts. Subcutaneous fat interstitial edema. DISC LEVELS: Mildly motion degraded examination. C2-3: No disc bulge, canal stenosis nor neural foraminal narrowing. C3-4 and C4-5: Tiny central disc protrusion without canal stenosis or neural foraminal narrowing. C5-6: Minimal annular bulging without canal stenosis neural foraminal narrowing. C6-7: Minimal annular bulging and tiny central disc protrusion without canal stenosis or neural foraminal narrowing. C7-T1: No disc bulge, canal stenosis nor neural foraminal narrowing. MRI THORACIC SPINE FINDINGS ALIGNMENT: Maintenance of the thoracic kyphosis. No malalignment. VERTEBRAE/DISCS: Vertebral bodies are intact. Intervertebral disc morphology's and signal are normal. Scattered chronic Schmorl's nodes. Mild subacute discogenic endplate changes at T6-7, T10-11, T11-12. CORD: Thoracic spinal cord is normal morphology and signal characteristics  to the level the conus medullaris which terminates at T12-L1. PREVERTEBRAL AND PARASPINAL SOFT TISSUES: Mild subcutaneous fat interstitial dependent edema. Prominent upper and mid thoracic dorsal epidural fat. Life-support lines in place, not tailored for evaluation. DEGENERATIVE CHANGE: No canal stenosis or neural foraminal narrowing at any level. At T6-7 is small RIGHT central disc protrusion. At T7-8 is small LEFT central disc protrusion and annular fissure.  IMPRESSION: MRI CERVICAL SPINE: No MR findings of spinal cord infarct though diffusion weighted sequences not obtained. Negative noncontrast MRI of the cervical spine. MRI THORACIC SPINE: No MR findings of spinal cord infarct though diffusion-weighted sequences not obtained. Small mid thoracic disc protrusions without canal stenosis, neural foraminal narrowing or acute osseous process. Electronically Signed   By: Awilda Metro M.D.   On: 02/06/2016 00:53   Mr Thoracic Spine Wo Contrast  02/06/2016  CLINICAL DATA:  Extensive cerebral and abdominal infarcts including ischemic bowel. Leg weakness. EXAM: MRI CERVICAL, THORACIC SPINE WITHOUT CONTRAST TECHNIQUE: Multiplanar and multiecho pulse sequences of the cervical spine, to include the craniocervical junction and cervicothoracic junction, and thoracic spine, were obtained without intravenous contrast. COMPARISON:  None. FINDINGS: MRI CERVICAL SPINE FINDINGS ALIGNMENT: Straightened cervical lordosis.  No malalignment. VERTEBRAE/DISCS: Vertebral bodies are intact. Intervertebral disc morphology's and signal are normal. CORD:Cervical spinal cord is normal morphology and signal characteristics from the cervicomedullary junction to level of T1-2, the most caudal well visualized level. POSTERIOR FOSSA, VERTEBRAL ARTERIES, PARASPINAL TISSUES: No MR findings of ligamentous injury. Vertebral artery flow voids present. Included view of the posterior fossa demonstrates multiple cerebellar infarcts. Subcutaneous fat interstitial edema. DISC LEVELS: Mildly motion degraded examination. C2-3: No disc bulge, canal stenosis nor neural foraminal narrowing. C3-4 and C4-5: Tiny central disc protrusion without canal stenosis or neural foraminal narrowing. C5-6: Minimal annular bulging without canal stenosis neural foraminal narrowing. C6-7: Minimal annular bulging and tiny central disc protrusion without canal stenosis or neural foraminal narrowing. C7-T1: No disc bulge, canal  stenosis nor neural foraminal narrowing. MRI THORACIC SPINE FINDINGS ALIGNMENT: Maintenance of the thoracic kyphosis. No malalignment. VERTEBRAE/DISCS: Vertebral bodies are intact. Intervertebral disc morphology's and signal are normal. Scattered chronic Schmorl's nodes. Mild subacute discogenic endplate changes at T6-7, T10-11, T11-12. CORD: Thoracic spinal cord is normal morphology and signal characteristics to the level the conus medullaris which terminates at T12-L1. PREVERTEBRAL AND PARASPINAL SOFT TISSUES: Mild subcutaneous fat interstitial dependent edema. Prominent upper and mid thoracic dorsal epidural fat. Life-support lines in place, not tailored for evaluation. DEGENERATIVE CHANGE: No canal stenosis or neural foraminal narrowing at any level. At T6-7 is small RIGHT central disc protrusion. At T7-8 is small LEFT central disc protrusion and annular fissure. IMPRESSION: MRI CERVICAL SPINE: No MR findings of spinal cord infarct though diffusion weighted sequences not obtained. Negative noncontrast MRI of the cervical spine. MRI THORACIC SPINE: No MR findings of spinal cord infarct though diffusion-weighted sequences not obtained. Small mid thoracic disc protrusions without canal stenosis, neural foraminal narrowing or acute osseous process. Electronically Signed   By: Awilda Metro M.D.   On: 02/06/2016 00:53   Dg Chest Portable 1 View  02/06/2016  CLINICAL DATA:  Respiratory failure. EXAM: PORTABLE CHEST 1 VIEW COMPARISON:  02/05/2016. FINDINGS: Endotracheal tube, NG tube, right IJ line stable position. Cardiomegaly with persistent bilateral pulmonary interstitial prominence consistent with congestive heart failure. Small left pleural effusion. No pneumothorax . IMPRESSION: 1. Lines and tubes in stable position. 2.Persistent changes of congestive heart failure with bilateral pulmonary interstitial edema and small left pleural effusion.  No significant change from prior exam. Electronically Signed    By: Maisie Fus  Register   On: 02/06/2016 07:41     STUDIES:  01/26/2016 outside hospital TTE: Left atrial enlargement, contrast suggestive of patent foramen ovale 01/26/2016 bilateral carotid ultrasound: No significant stenosis 01/26/2016 ultrasound Doppler legs>  negative for  DVT 01/27/2016 MR angiogram pelvis: Mild compression of the left common iliac vein without significant narrowing or thrombosis 01/27/2016 outside hospital TTE> Normal valves, left atrial enlargement, there is no abnormal contrast to suggest intra-atrial shunting 6/5 CT abdomen OSH> CT angiogram findings worrisome for superior mesenteric artery clot, small bowel ischemia, infarcts to kidney and spleen.  CULTURES: None  ANTIBIOTICS: 01/31/2016 ertapenem >>  Vanc 6/12>>>  SIGNIFICANT EVENTS: 6/5 CTA abd >>>worrisome for superior mesenteric artery clot 6/5 OR >> small bowel resection, aortomesenteric bypass  6/9 back to OR>> abd closure   LINES/TUBES: 02/23/2016 endotracheal tube  DISCUSSION: This is a 59 year old male who has small bowel ischemia, possible renal infarct in the setting of the superior mesenteric artery clot. This occurs several days after recent hospitalization for stroke thought to be embolic.  Uncertain etiology of hypercoagulable state.  Remains critically ill, s/p surgical repair.     ASSESSMENT / PLAN:  PULMONARY A: Mechanical ventilatory support postoperative History of tobacco use ? Left lower lobe pna Worsening  P:   Diuresis as below  PS as able no extubation given neuro status. Titrate O2 for sat of 88-92%.  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 Consider repeat TEE or TTE  RENAL A:   Concern for partial right renal infarct on CT angiogram, normal renal function P:   Monitor BMET and UOP Replace electrolytes as needed Lasix 8 mg/hr IV drip. Zaroxolyn 5 mg PO x1.  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 Nothing by mouth. PPI. Continue TPN for now.  HEMATOLOGIC A:   Concern for hypercoagulable state Stents of the workup for embolic cause of multiple arterial strokes negative at outside hospital 3-4 days prior to admission P:  Hypercoagulability panel > negative. Hold heparin until okay by neurology, presumably June 13 (14 days post stroke) Hematology feels clots are due to a cardiac origin.  INFECTIOUS A:   Peritonitis P:   Ertapenem 6/6>>> Vancomycin 6/12>>>  ENDOCRINE A:   No acute  issues P:   Monitor glucose  NEUROLOGIC A:   Recent stroke P:   RASS goal: -1 Lighten sedation as able. Fentanyl PRN  Versed PRN   FAMILY  - Updates: no family available 6/12, vascular updated family over the phone.  - Inter-disciplinary family meet or Palliative Care meeting due by:  day 7  The patient is critically ill with multiple organ systems failure and requires high complexity decision making for assessment and support, frequent evaluation and titration of therapies, application of advanced monitoring technologies and extensive interpretation of multiple databases.   Critical Care Time devoted to patient care services described in this note is  35  Minutes. This time reflects time of care of this signee Dr Koren Bound. This critical care time does not reflect procedure time, or teaching time  or supervisory time of PA/NP/Med student/Med Resident etc but could involve care discussion time.  Alyson ReedyWesam G. Vashaun Osmon, M.D. [facility] Pulmonary/Critical Care Medicine. Pager:  [phone]. After hours pager: [phone].

## 2016-02-06 NOTE — Procedures (Signed)
ELECTROENCEPHALOGRAM REPORT  Date of Study: 02/06/2016  Patient's Name: Brian Baxter MRN: [id number] Date of Birth: July 24, 1956  Referring Provider: Burnetta Sabin, NP  Indication: 60 y.o. male with a history of recent strokes, now with difficulty waking up from sedation following bowel resection.  Patient is ventilated and sedated.  Medications: Fentanyl injection at 1248 TPN (CLINIMIX-E) Adult    .TPN (CLINIMIX-E) Adult    0.9 % sodium chloride infusion  L1 acetaminophen (TYLENOL) suppository 650 mg  L1 acetaminophen (TYLENOL) tablet 650 mg    albuterol (PROVENTIL) (2.5 MG/3ML) 0.083% nebulizer solution 2.5 mg    alum & mag hydroxide-simeth (MAALOX/MYLANTA) 200-200-20 MG/5ML suspension 15-30 mL    antiseptic oral rinse solution (CORINZ)    aspirin suppository 300 mg    chlorhexidine gluconate (SAGE KIT) (PERIDEX) 0.12 % solution 15 mL    dextrose 5 % and 0.9 % NaCl with KCl 20 mEq/L infusion    docusate (COLACE) 50 MG/5ML liquid 100 mg    ertapenem (INVANZ) 1 g in sodium chloride 0.9 % 50 mL IVPB    fentaNYL (SUBLIMAZE) injection 25-100 mcg    hydrALAZINE (APRESOLINE) injection 5 mg    insulin aspart (novoLOG) injection 0-9 Units    metoprolol (LOPRESSOR) injection 2-5 mg    midazolam (VERSED) injection 2 mg  L2 ondansetron (ZOFRAN) injection 4 mg  L2 ondansetron (ZOFRAN) tablet 4 mg    phenol (CHLORASEPTIC) mouth spray 1 spray    sodium chloride flush (NS) 0.9 % injection 10-40 mL    sodium chloride flush (NS) 0.9 % injection 3 mL   Technical Summary: This is a multichannel digital EEG recording, using the international 10-20 placement system with electrodes applied with paste and impedances below 5000 ohms.    Description: The EEG background is symmetric and diffusely slow, consisting of 2-3 Hz delta and 5-6 Hz theta activity.  There is no discernible posterior dominant rhythm.  No focal or generalized epileptiform discharges are seen.  Stage II sleep is not  seen.  Hyperventilation and photic stimulation were not performed.  ECG revealed normal cardiac rate and rhythm.  Impression: This is an abnormal routine EEG due to generalized background slowing, consistent with diffuse cerebral dysfunction.  This is a nonspecific finding seen with toxic-metabolic etiologies but also pharmacologic effect from sedation.  Leimomi Zervas R. Tomi Likens, DO

## 2016-02-06 NOTE — Consult Note (Signed)
WOC wound consult note Reason for Consult: Consult requested for Vac dressing change; pt is followed by the surgery team. Wound type: Full thickness post-op wound to midline abd Measurement: 27X7X4cm Wound bed: Beefy red Drainage (amount, consistency, odor) small amt pink drainage, no odor Periwound: Intact Dressing procedure/placement/frequency: Applied one piece black foam to 125mm cont suction.  Pt sedated and tolerated without apparent discomfort.  Plan for bedside nurses to change Q M/W/F Please re-consult if further assistance is needed.  Thank-you,  Brian Mcgeeawn Tysheena Ginzburg MSN, RN, CWOCN, [place], CNS [phone]

## 2016-02-06 NOTE — Progress Notes (Signed)
Subjective: Interval History: none.. Dynamically stable on vent. Occasionally coughing on the vent but no purposeful movement  Objective: Vital signs in last 24 hours: Temp:  [98.4 F (36.9 C)-102.5 F (39.2 C)] 102.5 F (39.2 C) (06/12 1305) Pulse Rate:  [50-134] 84 (06/12 1300) Resp:  [21-36] 25 (06/12 1300) BP: (79-188)/(47-136) 159/136 mmHg (06/12 1122) SpO2:  [100 %] 100 % (06/12 1300) Arterial Line BP: (77-178)/(39-80) 157/73 mmHg (06/12 1300) FiO2 (%):  [40 %] 40 % (06/12 1300) Weight:  [176 lb 5.9 oz (80 kg)] 176 lb 5.9 oz (80 kg) (06/12 0400)  Intake/Output from previous day: 06/11 0701 - 06/12 0700 In: 1399.5 [I.V.:588.8; NG/GT:210; IV Piggyback:100; TPN:500.7] Out: 5060 [Urine:3160; Emesis/NG output:1850; Drains:50] Intake/Output this shift: Total I/O In: 450 [I.V.:60; NG/GT:150; TPN:240] Out: 945 [Urine:545; Emesis/NG output:400]  Abdomen soft VAC in place. Palpable pedal pulses bilaterally  Lab Results:  Recent Labs  02/05/16 0400 02/06/16 0418  WBC 18.9* 19.6*  HGB 9.7* 9.0*  HCT 28.6* 26.2*  PLT 46* 42*   BMET  Recent Labs  02/05/16 0400 02/06/16 0418  NA 137 138  K 4.3 3.8  CL 114* 110  CO2 19* 20*  GLUCOSE 146* 132*  BUN 19 32*  CREATININE 0.80 1.04  CALCIUM 7.1* 7.1*    Studies/Results: Mr Brain Wo Contrast  02/05/2016  CLINICAL DATA:  Multifocal embolic infarcts, splenic and bowel infarcts. Assess for further embolic ischemia. Worsening leg weakness. EXAM: MRI HEAD WITHOUT CONTRAST TECHNIQUE: Multiplanar, multiecho pulse sequences of the brain and surrounding structures were obtained without intravenous contrast. COMPARISON:  CT HEAD Jan 25, 2016 FINDINGS: INTRACRANIAL CONTENTS: Patchy reduced diffusion in bilateral cerebellum, vermis. Confluent reduced diffusion within frontal, parietal, occipital lobes. Extension of reduced diffusion to RIGHT posterior temporal lobe. Patchy reduced diffusion splenium of the corpus callosum. Patchy reduced  diffuse in LEFT posterior temporal lobe. Patchy reduced diffusion LEFT basal ganglia. All areas of reduced diffusion demonstrate low ADC values. Patchy areas of susceptibility artifact within areas of infarct associated with T1 shortening. Local sulcal effacement. 3 mm RIGHT to LEFT midline shift with mass effect on RIGHT lateral ventricle atrium. No ventricular entrapment or hydrocephalus. No abnormal extra-axial fluid collections. Major intracranial vascular flow voids present at skull base. ORBITS: Trace RIGHT maxillary sinus mucosal thickening. Small bilateral mastoid effusions. SINUSES: The mastoid air-cells and included paranasal sinuses are well-aerated. SKULL/SOFT TISSUES: No abnormal sellar expansion. No suspicious calvarial bone marrow signal. Craniocervical junction maintained. IMPRESSION: Extensive supra and infratentorial acute infarcts, including RIGHT greater than LEFT MCA and PCA territories consistent with embolic phenomena. Petechial hemorrhage without frank hemorrhagic conversion. 3 mm RIGHT to LEFT midline shift without ventricular entrapment. These results will be called to the ordering clinician or representative by the Radiologist Assistant, and communication documented in the PACS or zVision Dashboard. Electronically Signed   By: Awilda Metroourtnay  Bloomer M.D.   On: 02/05/2016 23:24   Mr Cervical Spine Wo Contrast  02/06/2016  CLINICAL DATA:  Extensive cerebral and abdominal infarcts including ischemic bowel. Leg weakness. EXAM: MRI CERVICAL, THORACIC SPINE WITHOUT CONTRAST TECHNIQUE: Multiplanar and multiecho pulse sequences of the cervical spine, to include the craniocervical junction and cervicothoracic junction, and thoracic spine, were obtained without intravenous contrast. COMPARISON:  None. FINDINGS: MRI CERVICAL SPINE FINDINGS ALIGNMENT: Straightened cervical lordosis.  No malalignment. VERTEBRAE/DISCS: Vertebral bodies are intact. Intervertebral disc morphology's and signal are normal.  CORD:Cervical spinal cord is normal morphology and signal characteristics from the cervicomedullary junction to level of T1-2, the most caudal well visualized  level. POSTERIOR FOSSA, VERTEBRAL ARTERIES, PARASPINAL TISSUES: No MR findings of ligamentous injury. Vertebral artery flow voids present. Included view of the posterior fossa demonstrates multiple cerebellar infarcts. Subcutaneous fat interstitial edema. DISC LEVELS: Mildly motion degraded examination. C2-3: No disc bulge, canal stenosis nor neural foraminal narrowing. C3-4 and C4-5: Tiny central disc protrusion without canal stenosis or neural foraminal narrowing. C5-6: Minimal annular bulging without canal stenosis neural foraminal narrowing. C6-7: Minimal annular bulging and tiny central disc protrusion without canal stenosis or neural foraminal narrowing. C7-T1: No disc bulge, canal stenosis nor neural foraminal narrowing. MRI THORACIC SPINE FINDINGS ALIGNMENT: Maintenance of the thoracic kyphosis. No malalignment. VERTEBRAE/DISCS: Vertebral bodies are intact. Intervertebral disc morphology's and signal are normal. Scattered chronic Schmorl's nodes. Mild subacute discogenic endplate changes at T6-7, T10-11, T11-12. CORD: Thoracic spinal cord is normal morphology and signal characteristics to the level the conus medullaris which terminates at T12-L1. PREVERTEBRAL AND PARASPINAL SOFT TISSUES: Mild subcutaneous fat interstitial dependent edema. Prominent upper and mid thoracic dorsal epidural fat. Life-support lines in place, not tailored for evaluation. DEGENERATIVE CHANGE: No canal stenosis or neural foraminal narrowing at any level. At T6-7 is small RIGHT central disc protrusion. At T7-8 is small LEFT central disc protrusion and annular fissure. IMPRESSION: MRI CERVICAL SPINE: No MR findings of spinal cord infarct though diffusion weighted sequences not obtained. Negative noncontrast MRI of the cervical spine. MRI THORACIC SPINE: No MR findings of spinal  cord infarct though diffusion-weighted sequences not obtained. Small mid thoracic disc protrusions without canal stenosis, neural foraminal narrowing or acute osseous process. Electronically Signed   By: Awilda Metro M.D.   On: 02/06/2016 00:53   Mr Thoracic Spine Wo Contrast  02/06/2016  CLINICAL DATA:  Extensive cerebral and abdominal infarcts including ischemic bowel. Leg weakness. EXAM: MRI CERVICAL, THORACIC SPINE WITHOUT CONTRAST TECHNIQUE: Multiplanar and multiecho pulse sequences of the cervical spine, to include the craniocervical junction and cervicothoracic junction, and thoracic spine, were obtained without intravenous contrast. COMPARISON:  None. FINDINGS: MRI CERVICAL SPINE FINDINGS ALIGNMENT: Straightened cervical lordosis.  No malalignment. VERTEBRAE/DISCS: Vertebral bodies are intact. Intervertebral disc morphology's and signal are normal. CORD:Cervical spinal cord is normal morphology and signal characteristics from the cervicomedullary junction to level of T1-2, the most caudal well visualized level. POSTERIOR FOSSA, VERTEBRAL ARTERIES, PARASPINAL TISSUES: No MR findings of ligamentous injury. Vertebral artery flow voids present. Included view of the posterior fossa demonstrates multiple cerebellar infarcts. Subcutaneous fat interstitial edema. DISC LEVELS: Mildly motion degraded examination. C2-3: No disc bulge, canal stenosis nor neural foraminal narrowing. C3-4 and C4-5: Tiny central disc protrusion without canal stenosis or neural foraminal narrowing. C5-6: Minimal annular bulging without canal stenosis neural foraminal narrowing. C6-7: Minimal annular bulging and tiny central disc protrusion without canal stenosis or neural foraminal narrowing. C7-T1: No disc bulge, canal stenosis nor neural foraminal narrowing. MRI THORACIC SPINE FINDINGS ALIGNMENT: Maintenance of the thoracic kyphosis. No malalignment. VERTEBRAE/DISCS: Vertebral bodies are intact. Intervertebral disc morphology's  and signal are normal. Scattered chronic Schmorl's nodes. Mild subacute discogenic endplate changes at T6-7, T10-11, T11-12. CORD: Thoracic spinal cord is normal morphology and signal characteristics to the level the conus medullaris which terminates at T12-L1. PREVERTEBRAL AND PARASPINAL SOFT TISSUES: Mild subcutaneous fat interstitial dependent edema. Prominent upper and mid thoracic dorsal epidural fat. Life-support lines in place, not tailored for evaluation. DEGENERATIVE CHANGE: No canal stenosis or neural foraminal narrowing at any level. At T6-7 is small RIGHT central disc protrusion. At T7-8 is small LEFT central disc protrusion and annular  fissure. IMPRESSION: MRI CERVICAL SPINE: No MR findings of spinal cord infarct though diffusion weighted sequences not obtained. Negative noncontrast MRI of the cervical spine. MRI THORACIC SPINE: No MR findings of spinal cord infarct though diffusion-weighted sequences not obtained. Small mid thoracic disc protrusions without canal stenosis, neural foraminal narrowing or acute osseous process. Electronically Signed   By: Awilda Metro M.D.   On: 02/06/2016 00:53   Dg Chest Portable 1 View  02/06/2016  CLINICAL DATA:  Respiratory failure. EXAM: PORTABLE CHEST 1 VIEW COMPARISON:  02/05/2016. FINDINGS: Endotracheal tube, NG tube, right IJ line stable position. Cardiomegaly with persistent bilateral pulmonary interstitial prominence consistent with congestive heart failure. Small left pleural effusion. No pneumothorax . IMPRESSION: 1. Lines and tubes in stable position. 2.Persistent changes of congestive heart failure with bilateral pulmonary interstitial edema and small left pleural effusion. No significant change from prior exam. Electronically Signed   By: Maisie Fus  Register   On: 02/06/2016 07:41   Dg Chest Portable 1 View  02/05/2016  CLINICAL DATA:  Respiratory failure EXAM: PORTABLE CHEST 1 VIEW COMPARISON:  02/04/2016 FINDINGS: Cardiomegaly again noted.  Stable endotracheal and NG tube position. Right IJ central line is unchanged in position. Mild interstitial prominence bilaterally suspicious for mild interstitial edema. Persistent bilateral lower lobe atelectasis or infiltrate left greater than right. Atherosclerotic calcifications of thoracic aorta. IMPRESSION: Stable endotracheal and NG tube position. Right IJ central line is unchanged in position. Mild interstitial prominence bilaterally suspicious for mild interstitial edema. Persistent bilateral lower lobe atelectasis or infiltrate left greater than right. Electronically Signed   By: Natasha Mead M.D.   On: 02/05/2016 09:53   Dg Chest Port 1 View  02/04/2016  CLINICAL DATA:  Acute respiratory failure EXAM: PORTABLE CHEST 1 VIEW COMPARISON:  14-Feb-2016 FINDINGS: Stable support apparatus. Cardiomediastinal silhouette is stable. Persistent infiltrate in medial right base and left perihilar region. No other changes. IMPRESSION: Persistent pulmonary infiltrates and stable support apparatus. Electronically Signed   By: Gerome Sam III M.D   On: 02/04/2016 07:54   Dg Chest Port 1 View  14-Feb-2016  CLINICAL DATA:  Ventilator dependent respiratory failure. Followup left lower lobe atelectasis and/or pneumonia. EXAM: PORTABLE CHEST 1 VIEW COMPARISON:  02/02/2016 and earlier. FINDINGS: Endotracheal tube tip in satisfactory position below the thoracic inlet. Right subclavian central venous catheter tip projects over the mid SVC, unchanged. Nasogastric tube courses below the diaphragm into the stomach. Persistent dense consolidation in the left lower lobe. Interval worsening of airspace opacities in the right lung base. Cardiac silhouette mildly to moderately enlarged, unchanged. Pulmonary venous hypertension without overt edema currently. IMPRESSION: 1.  Support apparatus satisfactory. 2. Stable dense left lower lobe atelectasis and/or pneumonia. 3. Worsening right basilar atelectasis and/or pneumonia.  Electronically Signed   By: Hulan Saas M.D.   On: 02/14/2016 07:53   Dg Chest Port 1 View  02/02/2016  CLINICAL DATA:  Respiratory failure EXAM: PORTABLE CHEST 1 VIEW COMPARISON:  01/31/2016 FINDINGS: There is an ET tube with tip above the carina. There is a right IJ catheter tip in the projection of the SVC. Moderate cardiac enlargement identified. Aortic atherosclerosis noted. There is a moderate left pleural effusion. Diminished aeration to the right midlung and left base is similar to previous exam. Fluid along the minor fissure is identified within the right midlung. IMPRESSION: 1. Persistent decreased aeration to the left midlung and left base. 2. Bilateral pleural effusions left greater than right. Electronically Signed   By: Veronda Prude.D.  On: 02/02/2016 08:23   Dg Chest Port 1 View  02/07/2016  CLINICAL DATA:  Abdominal surgery, endotracheal tube placement EXAM: PORTABLE CHEST 1 VIEW COMPARISON:  Portable chest x-ray of 01/31/2016 and 02/08/2016, CT chest of 01/24/2006 FINDINGS: There has been and increase in bibasilar opacities left-greater-than-right most consistent with atelectasis and possible effusion on the left. Pneumonia particularly the left lung base cannot be excluded. The tip of the endotracheal tube is approximately 4.3 cm above the carina. Right IJ central venous line tip overlies the mid SVC and the NG tube extends below the hemidiaphragm. IMPRESSION: 1. Increasing basilar opacities left-greater-than-right most consistent with atelectasis and possible left effusion. Cannot exclude pneumonia at the left lung base. 2. Tip of endotracheal tube 4.3 cm above the carina. Electronically Signed   By: Dwyane Dee M.D.   On: 02/22/2016 08:21   Portable Chest Xray  01/31/2016  CLINICAL DATA:  60 year old male with acute respiratory failure and hypoxia. Status post exploratory laparotomy. EXAM: PORTABLE CHEST 1 VIEW COMPARISON:  None FINDINGS: Endotracheal tube with tip approximately  2.3 cm above the carina. Recommend retraction by approximately 3- 4 cm for optimal positioning. An enteric tube is partially visualized coursing to the left in the abdomen. There is a right IJ central venous line with tip over central SVC. There is atelectatic changes at the left lung base. No focal consolidation. No significant pleural effusion. No pneumothorax. Top-normal cardiac size. No acute osseous pathology. IMPRESSION: Endotracheal tube above the carina. Left lung base atelectatic changes. No focal consolidation. No pneumothorax. Electronically Signed   By: Elgie Collard M.D.   On: 01/31/2016 03:29   Anti-infectives: Anti-infectives    Start     Dose/Rate Route Frequency Ordered Stop   01/31/16 0400  ertapenem (INVANZ) 1 g in sodium chloride 0.9 % 50 mL IVPB     1 g 100 mL/hr over 30 Minutes Intravenous Every 24 hours 01/31/16 0334     01/31/16 0100  [MAR Hold]  cefOXitin (MEFOXIN) 2 g in dextrose 5 % 50 mL IVPB     (MAR Hold since 01/31/16 0049)   2 g 100 mL/hr over 30 Minutes Intravenous To Surgery 01/31/16 0046 01/31/16 0118      Assessment/Plan: s/p Procedure(s): EXPLORATORY LAPAROTOMY (N/A) SMALL BOWEL RESECTION (N/A) Reviewed events weekend. A new major right brain stroke. Appreciate neurology's assistance. Currently undergoing EEG. Will discuss goals of care with family. His son had expressed to me last week benefit became clear that efforts were futile he did not want heroic measures for his father   LOS: 7 days   Gretta Began 02/06/2016, 3:24 PM

## 2016-02-06 NOTE — Progress Notes (Signed)
Transported pt to and from CT on vent with no complications

## 2016-02-06 NOTE — Progress Notes (Signed)
Brian Baxter   DOB:12/26/1955   RU#:[id number]#:[id number]    Subjective: Patient remains sedated and intubated. Family members were not present. Nursing staff did not report excessive bleeding. Events over the weekend is noted including imaging studies. Patient was started on total parenteral nutrition  Objective:  Filed Vitals:   02/06/16 0600 02/06/16 0700  BP: 129/79 134/123  Pulse: 50 79  Temp:    Resp: 31 21     Intake/Output Summary (Last 24 hours) at 02/06/16 0721 Last data filed at 02/06/16 0700  Gross per 24 hour  Intake 1393.33 ml  Output   4935 ml  Net -3541.67 ml    GENERAL:He is intubated and sedated SKIN: skin color, texture, turgor are normal, no rashes or significant lesions. Diffuse generalized edema ABDOMEN:abdomen soft, presence of wound VAC  NEURO: Sedated and intubated   Labs:  Lab Results  Component Value Date   WBC 19.6* 02/06/2016   HGB 9.0* 02/06/2016   HCT 26.2* 02/06/2016   MCV 87.0 02/06/2016   PLT 42* 02/06/2016   NEUTROABS 16.2* 02/06/2016    Lab Results  Component Value Date   NA 138 02/06/2016   K 3.8 02/06/2016   CL 110 02/06/2016   CO2 20* 02/06/2016    Studies:  Mr Brain Wo Contrast  02/05/2016  CLINICAL DATA:  Multifocal embolic infarcts, splenic and bowel infarcts. Assess for further embolic ischemia. Worsening leg weakness. EXAM: MRI HEAD WITHOUT CONTRAST TECHNIQUE: Multiplanar, multiecho pulse sequences of the brain and surrounding structures were obtained without intravenous contrast. COMPARISON:  CT HEAD Jan 25, 2016 FINDINGS: INTRACRANIAL CONTENTS: Patchy reduced diffusion in bilateral cerebellum, vermis. Confluent reduced diffusion within frontal, parietal, occipital lobes. Extension of reduced diffusion to RIGHT posterior temporal lobe. Patchy reduced diffusion splenium of the corpus callosum. Patchy reduced diffuse in LEFT posterior temporal lobe. Patchy reduced diffusion LEFT basal ganglia. All areas of reduced diffusion  demonstrate low ADC values. Patchy areas of susceptibility artifact within areas of infarct associated with T1 shortening. Local sulcal effacement. 3 mm RIGHT to LEFT midline shift with mass effect on RIGHT lateral ventricle atrium. No ventricular entrapment or hydrocephalus. No abnormal extra-axial fluid collections. Major intracranial vascular flow voids present at skull base. ORBITS: Trace RIGHT maxillary sinus mucosal thickening. Small bilateral mastoid effusions. SINUSES: The mastoid air-cells and included paranasal sinuses are well-aerated. SKULL/SOFT TISSUES: No abnormal sellar expansion. No suspicious calvarial bone marrow signal. Craniocervical junction maintained. IMPRESSION: Extensive supra and infratentorial acute infarcts, including RIGHT greater than LEFT MCA and PCA territories consistent with embolic phenomena. Petechial hemorrhage without frank hemorrhagic conversion. 3 mm RIGHT to LEFT midline shift without ventricular entrapment. These results will be called to the ordering clinician or representative by the Radiologist Assistant, and communication documented in the PACS or zVision Dashboard. Electronically Signed   By: Awilda Metroourtnay  Bloomer M.D.   On: 02/05/2016 23:24   Mr Cervical Spine Wo Contrast  02/06/2016  CLINICAL DATA:  Extensive cerebral and abdominal infarcts including ischemic bowel. Leg weakness. EXAM: MRI CERVICAL, THORACIC SPINE WITHOUT CONTRAST TECHNIQUE: Multiplanar and multiecho pulse sequences of the cervical spine, to include the craniocervical junction and cervicothoracic junction, and thoracic spine, were obtained without intravenous contrast. COMPARISON:  None. FINDINGS: MRI CERVICAL SPINE FINDINGS ALIGNMENT: Straightened cervical lordosis.  No malalignment. VERTEBRAE/DISCS: Vertebral bodies are intact. Intervertebral disc morphology's and signal are normal. CORD:Cervical spinal cord is normal morphology and signal characteristics from the cervicomedullary junction to level  of T1-2, the most caudal well visualized level. POSTERIOR FOSSA, VERTEBRAL  ARTERIES, PARASPINAL TISSUES: No MR findings of ligamentous injury. Vertebral artery flow voids present. Included view of the posterior fossa demonstrates multiple cerebellar infarcts. Subcutaneous fat interstitial edema. DISC LEVELS: Mildly motion degraded examination. C2-3: No disc bulge, canal stenosis nor neural foraminal narrowing. C3-4 and C4-5: Tiny central disc protrusion without canal stenosis or neural foraminal narrowing. C5-6: Minimal annular bulging without canal stenosis neural foraminal narrowing. C6-7: Minimal annular bulging and tiny central disc protrusion without canal stenosis or neural foraminal narrowing. C7-T1: No disc bulge, canal stenosis nor neural foraminal narrowing. MRI THORACIC SPINE FINDINGS ALIGNMENT: Maintenance of the thoracic kyphosis. No malalignment. VERTEBRAE/DISCS: Vertebral bodies are intact. Intervertebral disc morphology's and signal are normal. Scattered chronic Schmorl's nodes. Mild subacute discogenic endplate changes at T6-7, T10-11, T11-12. CORD: Thoracic spinal cord is normal morphology and signal characteristics to the level the conus medullaris which terminates at T12-L1. PREVERTEBRAL AND PARASPINAL SOFT TISSUES: Mild subcutaneous fat interstitial dependent edema. Prominent upper and mid thoracic dorsal epidural fat. Life-support lines in place, not tailored for evaluation. DEGENERATIVE CHANGE: No canal stenosis or neural foraminal narrowing at any level. At T6-7 is small RIGHT central disc protrusion. At T7-8 is small LEFT central disc protrusion and annular fissure. IMPRESSION: MRI CERVICAL SPINE: No MR findings of spinal cord infarct though diffusion weighted sequences not obtained. Negative noncontrast MRI of the cervical spine. MRI THORACIC SPINE: No MR findings of spinal cord infarct though diffusion-weighted sequences not obtained. Small mid thoracic disc protrusions without canal  stenosis, neural foraminal narrowing or acute osseous process. Electronically Signed   By: Awilda Metro M.D.   On: 02/06/2016 00:53   Mr Thoracic Spine Wo Contrast  02/06/2016  CLINICAL DATA:  Extensive cerebral and abdominal infarcts including ischemic bowel. Leg weakness. EXAM: MRI CERVICAL, THORACIC SPINE WITHOUT CONTRAST TECHNIQUE: Multiplanar and multiecho pulse sequences of the cervical spine, to include the craniocervical junction and cervicothoracic junction, and thoracic spine, were obtained without intravenous contrast. COMPARISON:  None. FINDINGS: MRI CERVICAL SPINE FINDINGS ALIGNMENT: Straightened cervical lordosis.  No malalignment. VERTEBRAE/DISCS: Vertebral bodies are intact. Intervertebral disc morphology's and signal are normal. CORD:Cervical spinal cord is normal morphology and signal characteristics from the cervicomedullary junction to level of T1-2, the most caudal well visualized level. POSTERIOR FOSSA, VERTEBRAL ARTERIES, PARASPINAL TISSUES: No MR findings of ligamentous injury. Vertebral artery flow voids present. Included view of the posterior fossa demonstrates multiple cerebellar infarcts. Subcutaneous fat interstitial edema. DISC LEVELS: Mildly motion degraded examination. C2-3: No disc bulge, canal stenosis nor neural foraminal narrowing. C3-4 and C4-5: Tiny central disc protrusion without canal stenosis or neural foraminal narrowing. C5-6: Minimal annular bulging without canal stenosis neural foraminal narrowing. C6-7: Minimal annular bulging and tiny central disc protrusion without canal stenosis or neural foraminal narrowing. C7-T1: No disc bulge, canal stenosis nor neural foraminal narrowing. MRI THORACIC SPINE FINDINGS ALIGNMENT: Maintenance of the thoracic kyphosis. No malalignment. VERTEBRAE/DISCS: Vertebral bodies are intact. Intervertebral disc morphology's and signal are normal. Scattered chronic Schmorl's nodes. Mild subacute discogenic endplate changes at T6-7,  T10-11, T11-12. CORD: Thoracic spinal cord is normal morphology and signal characteristics to the level the conus medullaris which terminates at T12-L1. PREVERTEBRAL AND PARASPINAL SOFT TISSUES: Mild subcutaneous fat interstitial dependent edema. Prominent upper and mid thoracic dorsal epidural fat. Life-support lines in place, not tailored for evaluation. DEGENERATIVE CHANGE: No canal stenosis or neural foraminal narrowing at any level. At T6-7 is small RIGHT central disc protrusion. At T7-8 is small LEFT central disc protrusion and annular fissure. IMPRESSION: MRI CERVICAL  SPINE: No MR findings of spinal cord infarct though diffusion weighted sequences not obtained. Negative noncontrast MRI of the cervical spine. MRI THORACIC SPINE: No MR findings of spinal cord infarct though diffusion-weighted sequences not obtained. Small mid thoracic disc protrusions without canal stenosis, neural foraminal narrowing or acute osseous process. Electronically Signed   By: Awilda Metro M.D.   On: 02/06/2016 00:53   Dg Chest Portable 1 View  02/05/2016  CLINICAL DATA:  Respiratory failure EXAM: PORTABLE CHEST 1 VIEW COMPARISON:  02/04/2016 FINDINGS: Cardiomegaly again noted. Stable endotracheal and NG tube position. Right IJ central line is unchanged in position. Mild interstitial prominence bilaterally suspicious for mild interstitial edema. Persistent bilateral lower lobe atelectasis or infiltrate left greater than right. Atherosclerotic calcifications of thoracic aorta. IMPRESSION: Stable endotracheal and NG tube position. Right IJ central line is unchanged in position. Mild interstitial prominence bilaterally suspicious for mild interstitial edema. Persistent bilateral lower lobe atelectasis or infiltrate left greater than right. Electronically Signed   By: Natasha Mead M.D.   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 The only mild abnormality seen is borderline low protein C level but that could be appropriately depressed in the setting of recent blood clot. No evidence of lupus anticoagulant, Protein S deficiency, homocysteinemia or prothrombin gene mutation. Factor V Leiden test, JAK2 mutation study and PNH are pending. Overall, he has no explained cause of thrombosis from hematology stand point. Consider repeating cardiac work-up to evaluate for embolic source  #6 leukocytosis Likely due to recent surgery and possibly infection. He is on antibiotic therapy  #7 anemia of chronic disease Likely due to consumption from recent surgery and anemia chronic disease. Monitor closely, transfuse prn  #8 thrombocytopenia This is acute in nature, likely due to recent consumption from surgery and mild bone marrow depression from side effects of antibiotic therapy and evidence of synthetic liver dysfunction With platelet count trending down, his risk of bleeding is greater when platelet count is less than 50,000 Transfuse platelet if platelet count is less than 20,000  #9 Elevated liver enzymes, synthetic liver dysfunction Likely due to recent surgery or result from TPN. Observe closely  #10 Severe protein calorie malnutrition On TPN  Discharge planning He is critically ill, will remain in ICU I will return to check on him on Wednesday, 02/08/2016. Call if questions arise  [facility], Thresa Dozier, MD 02/06/2016  7:21 AM

## 2016-02-06 NOTE — Progress Notes (Addendum)
CRITICAL VALUE ALERT  Critical value received:  Trop 3.41  Date of notification:  02/06/16  Time of notification:  1310  Critical value read back:Yes.    Nurse who received alert:  Lonia BloodJamie Adonay Scheier, RN  MD notified (1st page):   Time of first page:    MD notified (2nd page):  Time of second page:  Responding MD:    Time MD responded:    Result reported to primary RN caring for pt [facility]on, [place]

## 2016-02-06 NOTE — Progress Notes (Signed)
eLink Physician-Brief Progress Note Patient Name: Brian BillMichael E Baxter DOB: 1956/08/06 MRN: [id number]   Date of Service  02/06/2016  HPI/Events of Note  Hypotension and Bradycardia - Patient on Precedex IV infusion.   eICU Interventions  Will order: 1. D/C Precedex IV infusion.  2. 0.9 NaCl 1 liter IV over 1 hour now. 3. Try to use Fentanyl IV PRN as already ordered.      Intervention Category Major Interventions: Hypotension - evaluation and management;Arrhythmia - evaluation and management  Ailed Defibaugh Eugene 02/06/2016, 2:51 AM

## 2016-02-06 NOTE — Anesthesia Postprocedure Evaluation (Signed)
Anesthesia Post Note  Patient: Brian Baxter  Procedure(s) Performed: Procedure(s) (LRB): EXPLORATORY LAPAROTOMY (N/A) SMALL BOWEL RESECTION (N/A)  Patient location during evaluation: PACU Anesthesia Type: General Level of consciousness: patient remains intubated per anesthesia plan Vital Signs Assessment: post-procedure vital signs reviewed and stable Respiratory status: patient remains intubated per anesthesia plan Cardiovascular status: blood pressure returned to baseline and stable Postop Assessment: no signs of nausea or vomiting Anesthetic complications: no                 Karna Abed,Fabrizzio A.

## 2016-02-06 NOTE — Progress Notes (Signed)
   02/06/16 1233  Clinical Encounter Type  Visited With Patient  Visit Type Social support  Referral From Nurse  Spiritual Encounters  Spiritual Needs Prayer  Stress Factors  Family Stress Factors Health changes  Chaplain visit made per nurse management request, patient unable to respond, healing prayer prayed for pt. Unable to locate family at this time.  Follow up to be made.

## 2016-02-06 NOTE — Progress Notes (Signed)
PARENTERAL NUTRITION CONSULT NOTE  Pharmacy Consult for TPN Indication: Massive Bowel Resection   Allergies  Allergen Reactions  . Atorvastatin Other (See Comments)    Myalgias (intolerance)  . Penicillins Hives and Rash  . Simvastatin Rash    Patient Measurements: Height: '5\' 9"'$  (175.3 cm) Weight: 176 lb 5.9 oz (80 kg) IBW/kg (Calculated) : 70.7   Vital Signs: Temp: 98.4 F (36.9 C) (06/12 0347) Temp Source: Axillary (06/12 0347) BP: 126/59 mmHg (06/12 0741) Pulse Rate: 68 (06/12 0741) Intake/Output from previous day: 06/11 0701 - 06/12 0700 In: 1393.3 [I.V.:582.7; NG/GT:210; IV Piggyback:100; TPN:500.7] Out: 4935 [Urine:3085; Emesis/NG output:1850] Intake/Output from this shift:    Labs:  Recent Labs  02/04/16 0410 02/05/16 0400 02/06/16 0418  WBC 21.8* 18.9* 19.6*  HGB 9.7* 9.7* 9.0*  HCT 29.0* 28.6* 26.2*  PLT 59* 46* 42*     Recent Labs  02/23/2016 1445 02/04/16 0410 02/05/16 0400 02/06/16 0418 02/06/16 0500  NA 133*  --  137 138  --   K 4.5  --  4.3 3.8  --   CL 109  --  114* 110  --   CO2 20*  --  19* 20*  --   GLUCOSE 157*  --  146* 132*  --   BUN 10  --  19 32*  --   CREATININE 0.71  --  0.80 1.04  --   CALCIUM 7.3*  --  7.1* 7.1*  --   MG  --  1.9  --  2.3  --   PHOS 3.0 2.9  --  5.8*  --   PROT  --   --   --  4.3*  --   ALBUMIN 1.2*  --   --  1.1*  --   AST  --   --   --  135*  --   ALT  --   --   --  76*  --   ALKPHOS  --   --   --  79  --   BILITOT  --   --   --  3.5*  --   PREALBUMIN  --   --   --  5.0*  --   TRIG  --   --   --   --  305*   Estimated Creatinine Clearance: 76.5 mL/min (by C-G formula based on Cr of 1.04).    Insulin Requirements in the past 24 hours:  3 units SSI  Assessment: 4 YOM who was admitted with small bowel ischemia (secondary to superior mesenteric artery clot) was taken urgently to OR and underwent exp lap and small bowel resection on 6/9. Pharmacy consulted to start TPN   Surgeries/Procedures: 6/9  Exp Lap & SBR   GI: POD #3. Has open abdomen and wound vac in place. prealbumin low at 5  Endo: CBGs relatively stable after starting TPN- 132-146  Lytes: K 3.8, Mg 2.3, Phos incr to 5.8, Corca ~9.5 (Ca x phos = 55)  Renal: SCr 0.8>1.04, UOP 1.6  Pulm: Intubated - 40% FiO2  Cards: HTN, VSS. Holding anticoagulation- ?starting 14 days after stroke (would be 6/13). Remains on ASA PR. Having bradycardia and hypotension d/t sedation  Hepatobil: AST elevated at 135, ALT up at 76, TBili high at 3.5, alk phos normal, albumin only 1.1. Trigs elevated to 305  Neuro: multiple embolic strokes from admission to [place] on 5/31- in both cerebral hemispheres as well as in anterior and posterior circulation. Hematology following for workup of embolic  events without clear source. TEE at outside hospital suggestive of PFO.   On PRN fentantyl, RASS -1, GCS 6, CPOT 0   ID: WBC 19.6, on D#7 of ertapenem for peritonitis. ?HCAP as well  Best Practices: Pepcid, MC   TPN Access: CVC triple lumen 6/5>>  TPN start date: 6/11>>   Nutritional Goals:  Awaiting RD recommendations-   Current Nutrition:  Clinimix E 5/15 at 40 mL/hr- provides 48g protein and 681kcal. Holding lipids for 7 days as patient is critically ill in ICU  Plan:  -Clinimix E 5/15 at 55m/hr- will need to remove lytes if phos x ca product increases. Hold lipids for 7 days since patient is critically ill in the ICU. Day 2/7 of holding lipids. -MVI and full trace elements in TPN- watch, if he becomes jaundiced, will need to cut trace elements to half -Pepcid 40 mg to TPN and d/c PO Protonix order (has not been given) -Continue SSI -change MIVF to NS @ 67mhr starting at 1800 tonight with new TPN bag -F/u RD recommendation for nutritional goals  -CMET in the morning to watch LFT trend as well as lytes   Keno Caraway D. Japhet Morgenthaler, PharmD, BCPS Clinical Pharmacist Pager: [phone]/07/2016 7:47 AM

## 2016-02-06 NOTE — Progress Notes (Signed)
Patient ID: Regis BillMichael E Smartt, male   DOB: 1955/12/13, 60 y.o.   MRN: [id number] Discussed with patient's wife and another family member. 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  I reviewed the CT angiogram of the neck. Shows extensive calcification at the origin of all arch vessels and also in the carotid bifurcation.  Will defer to neurology to give a prognosis regarding major right brain stroke. Mrs Eckman and their son both do not want heroic prolonged measures if he is not going to have quality survival.

## 2016-02-06 NOTE — Progress Notes (Signed)
Patient returned to 2S from MRI. While MRI patient had a couple of tachypneic episodes, agitations and copious oral secretions. RT suctioned ET Tube numerous times for large amount of greenish secretions. In the unit patient is now stable with respiratory rate returned to normal. RT will continue to monitor.

## 2016-02-06 NOTE — Progress Notes (Signed)
STROKE TEAM PROGRESS NOTE   HISTORY OF PRESENT ILLNESS (per record) Brian Baxter is a 60 y.o. male with a history of recent stroke on May 31. He was admitted with multiple embolic strokes which was worked up at [facility]. He underwent echo, TEE showing no source of emboli. These were in both the anterior and posterior circulation and in both cerebral hemispheres. They did an MRI pelvis looking for DVT that was not visible otherwise and this was negative. MRA head with only mild atherosclerotic changes of the ICAs. Carotid Dopplers with no stenosis. LDL 134  The largest was noted to be 2.4 x 4.2 cm the right occipital lobe. He then went home and presented to [facility] with abdominal pain. 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 He was transferred to [facility] for IV TPA administration, but given his recent CVA that was not possible. He was seen by Dr. early of vascular surgery who performed bowel resection with a wound VAC left in place.  Given his open abdomen, he was heavily sedated following the procedure. On lightning sedation, however he has been noted to not be waking up. Given his recent embolic infarcts, neurology has been consulted for further evaluation.  His LKW for this presentation is unclear. Patient was not administered IV t-PA secondary to recent stroke. He was admitted post op for further evaluation and treatment.   SUBJECTIVE (INTERVAL HISTORY) Patient is intubated. He remains unresponsive. He is on no sedation. I reviewed personally patient's prior hospitalization at [facility] and stroke workup. He had quite extensive workup including TEE, MRV of the pelvis, echocardiogram, Doppler studies of which was negative. He had marginally low protein C but rest of the hypercoagulable panel was negative. Clearly his follow-up MRI done here shows much more extensive infarcts than his initial admission at  [facility].   OBJECTIVE Temp:  [98.4 F (36.9 C)-101.8 F (38.8 C)] 98.4 F (36.9 C) (06/12 0347) Pulse Rate:  [50-134] 68 (06/12 0741) Cardiac Rhythm:  [-] Normal sinus rhythm (06/12 0400) Resp:  [21-39] 25 (06/12 0741) BP: (79-188)/(47-123) 126/59 mmHg (06/12 0741) SpO2:  [100 %] 100 % (06/12 0741) Arterial Line BP: (77-178)/(39-80) 149/69 mmHg (06/12 0700) FiO2 (%):  [40 %] 40 % (06/12 0741) Weight:  [80 kg (176 lb 5.9 oz)] 80 kg (176 lb 5.9 oz) (06/12 0400)  CBC:  Recent Labs Lab 02/05/16 0400 02/06/16 0418  WBC 18.9* 19.6*  NEUTROABS  --  16.2*  HGB 9.7* 9.0*  HCT 28.6* 26.2*  MCV 86.9 87.0  PLT 46* 42*    Basic Metabolic Panel:  Recent Labs Lab 02/04/16 0410 02/05/16 0400 02/06/16 0418  NA  --  137 138  K  --  4.3 3.8  CL  --  114* 110  CO2  --  19* 20*  GLUCOSE  --  146* 132*  BUN  --  19 32*  CREATININE  --  0.80 1.04  CALCIUM  --  7.1* 7.1*  MG 1.9  --  2.3  PHOS 2.9  --  5.8*    Lipid Panel:    Component Value Date/Time   TRIG 305* 02/06/2016 0500   HgbA1c: No results found for: HGBA1C Urine Drug Screen: No results found for: LABOPIA, COCAINSCRNUR, LABBENZ, AMPHETMU, THCU, LABBARB    IMAGING  Mr Brain Wo Contrast  02/05/2016  CLINICAL DATA:  Multifocal embolic infarcts, splenic and bowel infarcts. Assess for further embolic ischemia. Worsening leg  weakness. EXAM: MRI HEAD WITHOUT CONTRAST TECHNIQUE: Multiplanar, multiecho pulse sequences of the brain and surrounding structures were obtained without intravenous contrast. COMPARISON:  CT HEAD Jan 25, 2016 FINDINGS: INTRACRANIAL CONTENTS: Patchy reduced diffusion in bilateral cerebellum, vermis. Confluent reduced diffusion within frontal, parietal, occipital lobes. Extension of reduced diffusion to RIGHT posterior temporal lobe. Patchy reduced diffusion splenium of the corpus callosum. Patchy reduced diffuse in LEFT posterior temporal lobe. Patchy reduced diffusion LEFT basal ganglia. All areas of  reduced diffusion demonstrate low ADC values. Patchy areas of susceptibility artifact within areas of infarct associated with T1 shortening. Local sulcal effacement. 3 mm RIGHT to LEFT midline shift with mass effect on RIGHT lateral ventricle atrium. No ventricular entrapment or hydrocephalus. No abnormal extra-axial fluid collections. Major intracranial vascular flow voids present at skull base. ORBITS: Trace RIGHT maxillary sinus mucosal thickening. Small bilateral mastoid effusions. SINUSES: The mastoid air-cells and included paranasal sinuses are well-aerated. SKULL/SOFT TISSUES: No abnormal sellar expansion. No suspicious calvarial bone marrow signal. Craniocervical junction maintained. IMPRESSION: Extensive supra and infratentorial acute infarcts, including RIGHT greater than LEFT MCA and PCA territories consistent with embolic phenomena. Petechial hemorrhage without frank hemorrhagic conversion. 3 mm RIGHT to LEFT midline shift without ventricular entrapment. These results will be called to the ordering clinician or representative by the Radiologist Assistant, and communication documented in the PACS or zVision Dashboard. Electronically Signed   By: Elon Alas M.D.   On: 02/05/2016 23:24   Mr Cervical Spine Wo Contrast  02/06/2016  CLINICAL DATA:  Extensive cerebral and abdominal infarcts including ischemic bowel. Leg weakness. EXAM: MRI CERVICAL, THORACIC SPINE WITHOUT CONTRAST TECHNIQUE: Multiplanar and multiecho pulse sequences of the cervical spine, to include the craniocervical junction and cervicothoracic junction, and thoracic spine, were obtained without intravenous contrast. COMPARISON:  None. FINDINGS: MRI CERVICAL SPINE FINDINGS ALIGNMENT: Straightened cervical lordosis.  No malalignment. VERTEBRAE/DISCS: Vertebral bodies are intact. Intervertebral disc morphology's and signal are normal. CORD:Cervical spinal cord is normal morphology and signal characteristics from the cervicomedullary  junction to level of T1-2, the most caudal well visualized level. POSTERIOR FOSSA, VERTEBRAL ARTERIES, PARASPINAL TISSUES: No MR findings of ligamentous injury. Vertebral artery flow voids present. Included view of the posterior fossa demonstrates multiple cerebellar infarcts. Subcutaneous fat interstitial edema. DISC LEVELS: Mildly motion degraded examination. C2-3: No disc bulge, canal stenosis nor neural foraminal narrowing. C3-4 and C4-5: Tiny central disc protrusion without canal stenosis or neural foraminal narrowing. C5-6: Minimal annular bulging without canal stenosis neural foraminal narrowing. C6-7: Minimal annular bulging and tiny central disc protrusion without canal stenosis or neural foraminal narrowing. C7-T1: No disc bulge, canal stenosis nor neural foraminal narrowing. MRI THORACIC SPINE FINDINGS ALIGNMENT: Maintenance of the thoracic kyphosis. No malalignment. VERTEBRAE/DISCS: Vertebral bodies are intact. Intervertebral disc morphology's and signal are normal. Scattered chronic Schmorl's nodes. Mild subacute discogenic endplate changes at N4-7, T10-11, T11-12. CORD: Thoracic spinal cord is normal morphology and signal characteristics to the level the conus medullaris which terminates at T12-L1. PREVERTEBRAL AND PARASPINAL SOFT TISSUES: Mild subcutaneous fat interstitial dependent edema. Prominent upper and mid thoracic dorsal epidural fat. Life-support lines in place, not tailored for evaluation. DEGENERATIVE CHANGE: No canal stenosis or neural foraminal narrowing at any level. At T6-7 is small RIGHT central disc protrusion. At T7-8 is small LEFT central disc protrusion and annular fissure. IMPRESSION: MRI CERVICAL SPINE: No MR findings of spinal cord infarct though diffusion weighted sequences not obtained. Negative noncontrast MRI of the cervical spine. MRI THORACIC SPINE: No MR findings of spinal cord  infarct though diffusion-weighted sequences not obtained. Small mid thoracic disc protrusions  without canal stenosis, neural foraminal narrowing or acute osseous process. Electronically Signed   By: Elon Alas M.D.   On: 02/06/2016 00:53   Mr Thoracic Spine Wo Contrast  02/06/2016  CLINICAL DATA:  Extensive cerebral and abdominal infarcts including ischemic bowel. Leg weakness. EXAM: MRI CERVICAL, THORACIC SPINE WITHOUT CONTRAST TECHNIQUE: Multiplanar and multiecho pulse sequences of the cervical spine, to include the craniocervical junction and cervicothoracic junction, and thoracic spine, were obtained without intravenous contrast. COMPARISON:  None. FINDINGS: MRI CERVICAL SPINE FINDINGS ALIGNMENT: Straightened cervical lordosis.  No malalignment. VERTEBRAE/DISCS: Vertebral bodies are intact. Intervertebral disc morphology's and signal are normal. CORD:Cervical spinal cord is normal morphology and signal characteristics from the cervicomedullary junction to level of T1-2, the most caudal well visualized level. POSTERIOR FOSSA, VERTEBRAL ARTERIES, PARASPINAL TISSUES: No MR findings of ligamentous injury. Vertebral artery flow voids present. Included view of the posterior fossa demonstrates multiple cerebellar infarcts. Subcutaneous fat interstitial edema. DISC LEVELS: Mildly motion degraded examination. C2-3: No disc bulge, canal stenosis nor neural foraminal narrowing. C3-4 and C4-5: Tiny central disc protrusion without canal stenosis or neural foraminal narrowing. C5-6: Minimal annular bulging without canal stenosis neural foraminal narrowing. C6-7: Minimal annular bulging and tiny central disc protrusion without canal stenosis or neural foraminal narrowing. C7-T1: No disc bulge, canal stenosis nor neural foraminal narrowing. MRI THORACIC SPINE FINDINGS ALIGNMENT: Maintenance of the thoracic kyphosis. No malalignment. VERTEBRAE/DISCS: Vertebral bodies are intact. Intervertebral disc morphology's and signal are normal. Scattered chronic Schmorl's nodes. Mild subacute discogenic endplate changes  at B9-3, T10-11, T11-12. CORD: Thoracic spinal cord is normal morphology and signal characteristics to the level the conus medullaris which terminates at T12-L1. PREVERTEBRAL AND PARASPINAL SOFT TISSUES: Mild subcutaneous fat interstitial dependent edema. Prominent upper and mid thoracic dorsal epidural fat. Life-support lines in place, not tailored for evaluation. DEGENERATIVE CHANGE: No canal stenosis or neural foraminal narrowing at any level. At T6-7 is small RIGHT central disc protrusion. At T7-8 is small LEFT central disc protrusion and annular fissure. IMPRESSION: MRI CERVICAL SPINE: No MR findings of spinal cord infarct though diffusion weighted sequences not obtained. Negative noncontrast MRI of the cervical spine. MRI THORACIC SPINE: No MR findings of spinal cord infarct though diffusion-weighted sequences not obtained. Small mid thoracic disc protrusions without canal stenosis, neural foraminal narrowing or acute osseous process. Electronically Signed   By: Elon Alas M.D.   On: 02/06/2016 00:53   Dg Chest Portable 1 View  02/06/2016  CLINICAL DATA:  Respiratory failure. EXAM: PORTABLE CHEST 1 VIEW COMPARISON:  02/05/2016. FINDINGS: Endotracheal tube, NG tube, right IJ line stable position. Cardiomegaly with persistent bilateral pulmonary interstitial prominence consistent with congestive heart failure. Small left pleural effusion. No pneumothorax . IMPRESSION: 1. Lines and tubes in stable position. 2.Persistent changes of congestive heart failure with bilateral pulmonary interstitial edema and small left pleural effusion. No significant change from prior exam. Electronically Signed   By: Marcello Moores  Register   On: 02/06/2016 07:41   Dg Chest Portable 1 View  02/05/2016  CLINICAL DATA:  Respiratory failure EXAM: PORTABLE CHEST 1 VIEW COMPARISON:  02/04/2016 FINDINGS: Cardiomegaly again noted. Stable endotracheal and NG tube position. Right IJ central line is unchanged in position. Mild  interstitial prominence bilaterally suspicious for mild interstitial edema. Persistent bilateral lower lobe atelectasis or infiltrate left greater than right. Atherosclerotic calcifications of thoracic aorta. IMPRESSION: Stable endotracheal and NG tube position. Right IJ central line is unchanged in position.  Mild interstitial prominence bilaterally suspicious for mild interstitial edema. Persistent bilateral lower lobe atelectasis or infiltrate left greater than right. Electronically Signed   By: Lahoma Crocker M.D.   On: 02/05/2016 09:53       PHYSICAL EXAM Elderly caucasian male who is intubated. Not sedated. . Afebrile. Head is nontraumatic. Neck is supple without bruit.    Cardiac exam no murmur or gallop. Lungs are clear to auscultation. Distal pulses are well felt. Neurological Exam ;  Patient is unresponsive. Eyes are closed. He is intubated. Patient opens eyes partially to sternal rub .he does not follow any commands.. He has slight left gaze and upward gaze deviation. Pupils irregular but reactive. Fundi were not visualized. He does not blink to threat bilaterally. No facial weakness. Tongue midline. Patient is able to move right side to painful stimuli and can withdraw left lower extremity minimally to painful stimuli. No movement in the left upper extremity to pain. Deep tendon flexes are 2+ on the right and 1+ on the left. Left plantar upgoing right is equivocal. Gait was not tested. ASSESSMENT/PLAN Mr. Brian Baxter is a 60 y.o. 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 He had an emergent bowel resection with wound VAC. He did not receive IV t-PA due to recent stroke.   Recent Cryptogenic Stroke:   bilateral anterior and posterior circulation infarcts, embolic secondary to unknown source  MRI extensive scattered bilateral anterior and posterior circulation infarcts, L basal ganglia  lacune, largest R occipital (at [place])  MRA Unremarkable (at [facility])  Carotid Doppler  Neg (at [facility])  2D Echo  EF 60-65%. No source of embolus seen. PFO suspected as contrast shows intraatrial shunt (at [facility])  TEE no SOE, EF 50-55%. No intra-atrial shunt. No PFO seen (at [facility])  LE dopplers negative (at [place])  Repeat LE dopplers ordered  MRI  Extensive supra and infratentorial acute infarcts - R>L MCA and PCA territories. Petechial hemorrhage. 69m R to L shift ([facility])  MR CS  negative  MR thoracic spine small thoracic disc protrusions, o/w normal  MRI angio pelvis negative   Check CTA head and neck  Check EEG  Check 2D echo  Check vasculitic labs  CRP elevated 17.451, RPR neg, NH4 28, Vit B12 550, Vit D 16.5, Fer 421.5, Procalcitonin 0.17, ESR 15, UDS neg,   LDL 130 [facility]  Diet NPO time specified  .TPN (CLINIMIX-E) Adult  aspirin 81 mg daily prior to admission, now on aspirin 300 mg suppository daily  Ongoing aggressive stroke risk factor management  Therapy recommendations:  pending   Disposition:  pending   Postoperative ventilatory support ? LLL PNA  Hypotension/bradycardia Hx Hypertension  Variable BP - 79/47 ->188/79 past 24h  SBP 130s currently  Permissive hypertension (OK if < 220/120) but gradually normalize in 5-7 days  Long-term BP goal normotensive  Hyperlipidemia  Home meds:  lipitor 80  [facility]  Resume statin when able   Other Stroke Risk Factors  Hx stroke/TIA  01/25/2016 cryptogenic infarcts  Family hx stroke (father)  PVD  ? 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 peritonitis  Recently elevated troponin without reduced cardiac  function  Leukocytosis  Anemia of chronic disease  Thrombocytopenia   Elevated liver enzymes, synthetic liver dysfunction  Severe protein calorie malnutrition   Hospital day # 7 I have personally examined this patient, reviewed notes, independently viewed imaging studies, participated in medical decision making and plan of care. I have made any additions or clarifications directly to the above note. She has unfortunately had neurological worsening with now multiple right hemispheric as well as smaller left hemispheric infarcts in right cerebellar infarcts with a neurological exam being quite poor. The source of patient's multiple cerebral and splenic and probable infarcts is still undetermined despite extensive testing. Since patient is clearly has had significant extension of his recent strokes Recommend repeating transthoracic echo and checking CTA brain and neck, lower extremity venous Dopplers and vasculitic labs.. I had a long discussion in the bedside with the patient's wife and family and answered questions about his prognosis. I recommend continuing care for the next few days but unless there is significant neurological improvement patient is unlikely to survive without prolonged ventilatory support and nursing home care. Family needs just a little more time to make a decision about goals of care This patient is critically ill and at significant risk of neurological worsening, death and care requires constant monitoring of vital signs, hemodynamics,respiratory and cardiac monitoring, extensive review of multiple databases, frequent neurological assessment, discussion with family, other specialists and medical decision making of high complexity.I have made any additions or clarifications directly to the above note.This critical care time does not reflect procedure time, or teaching time or supervisory time of PA/NP/Med Resident etc but could involve care discussion time.  I spent 34 minutes of  neurocritical care time  in the care of  this patient.      Antony Contras, MD Medical Director [place] Pager: [phone] 02/06/2016 4:24 PM     To contact Stroke Continuity provider, please refer to [email or web address] After hours, contact General Neurology

## 2016-02-06 NOTE — Progress Notes (Signed)
Pharmacy Antibiotic Note  Brian Baxter is a 60 y.o. male who continues on day #7 ertapenem, s/p ex lap and small bowel resection on 6/9, now spiking fevers to 102.5. Pharmacy consulted to add vancomycin. Renal function stable.  Goal vancomycin 15-20  Plan: 1) Vancomycin 1g IV q12 2) Follow renal function, cultures, LOT, level as needed  Height: 5\' 9"  (175.3 cm) Weight: 176 lb 5.9 oz (80 kg) IBW/kg (Calculated) : 70.7  Temp (24hrs), Avg:100.7 F (38.2 C), Min:98.4 F (36.9 C), Max:102.5 F (39.2 C)   Recent Labs Lab 02/18/2016 2207 01/31/16 0230  02/02/16 0338 02/02/16 0430 2015/10/26 0400 2015/10/26 0726 2015/10/26 1445 02/04/16 0410 02/05/16 0400 02/06/16 0418  WBC 18.3* 17.0*  < > 21.1*  --  23.9*  --   --  21.8* 18.9* 19.6*  CREATININE 0.86 0.97  < > 0.75 0.71  --  0.72 0.71  --  0.80 1.04  LATICACIDVEN 0.8 0.9  --   --   --   --   --   --   --   --   --   < > = values in this interval not displayed.  Estimated Creatinine Clearance: 76.5 mL/min (by C-G formula based on Cr of 1.04).    Allergies  Allergen Reactions  . Atorvastatin Other (See Comments)    Myalgias (intolerance)  . Penicillins Hives and Rash  . Simvastatin Rash    Antimicrobials this admission: 6/6 Ertapenem >>  Dose adjustments this admission: n/a  Microbiology results: 6/5 MRSA PCR negative  Thank you for allowing pharmacy to be a part of this patient's care.  Fredrik RiggerMarkle, Paizleigh Wilds Sue 02/06/2016 3:37 PM

## 2016-02-06 NOTE — Progress Notes (Signed)
Nutrition Follow-up  DOCUMENTATION CODES:   Not applicable  INTERVENTION:  Re-estimated needs based on current MV and Tmax TPN per pharmacy    NUTRITION DIAGNOSIS:   Inadequate oral intake related to inability to eat as evidenced by NPO status.  Ongoing   GOAL:   Patient will meet greater than or equal to 90% of their needs  Unmet  MONITOR:   Other (Comment), Vent status, Labs, Weight trends, Skin, I & O's (TPN regimen )  ASSESSMENT:   Pt with hx HTN, tobacco abuse admitted 02/04/2016 in the setting of small bowel ischemia and possible right kidney ischemia. He was taken urgently to OR with gen surgery and vascular, abd left open with wound vac. Tx to ICU intubated, on pressors.   6/9 Ex lap and SBR Pt intubated on vent support. Temp (24hrs), Avg:100.4 F (38 C), Min:98.4 F (36.9 C), Max:101.8 F (38.8 C) MV 18.3 ml Re-estimated needs.  Spoke to [employer].  TPN per pharm 6/12: Clinimix E 5/15 at 40 mL/hr- provides 48g protein and 681kcal. Holding lipids for 7 days as patient is critically ill in ICU.  Labs reviewed; CBGs 122-146, BUN 32, Ca 7.1, phos 5.8,  K and mag WNL, alk phos WNL, TG 305.  Meds reviewed; D5NS w/ KCl 125 ml/hr (510 kcals)  Diet Order:  Diet NPO time specified .TPN (CLINIMIX-E) Adult .TPN (CLINIMIX-E) Adult  Skin:  Wound (see comment) (abdominal wound VAC)  Last BM:  Unknown  Height:   Ht Readings from Last 1 Encounters:  02/20/2016 '5\' 9"'$  (1.753 m)    Weight:   Wt Readings from Last 1 Encounters:  02/06/16 176 lb 5.9 oz (80 kg)    Ideal Body Weight:  73 kg  BMI:  Body mass index is 26.03 kg/(m^2).  Estimated Nutritional Needs:   Kcal:  2365  Protein:  120-130 g  Fluid:  Per MD  EDUCATION NEEDS:   No education needs identified at this time  Geoffery Lyons, [place] Dietetic Intern Pager [phone]

## 2016-02-06 NOTE — Progress Notes (Signed)
Pt frequently bradycardic 30-50's with drop in bp 80/48. Elink notified. MD unavailable at this time. Message left with nurse.

## 2016-02-06 NOTE — Progress Notes (Signed)
[facility] Progress Note 3 Days Post-Op  Subjective: The patient has had major strokes, and is not awakening quickly.  Recent MRI scan of the head shows a major stroke on the right.  Objective: Vital signs in last 24 hours: Temp:  [98.4 F (36.9 C)-101.8 F (38.8 C)] 101 F (38.3 C) (06/12 0900) Pulse Rate:  [50-134] 77 (06/12 0900) Resp:  [21-39] 32 (06/12 0900) BP: (79-188)/(47-123) 131/67 mmHg (06/12 0900) SpO2:  [100 %] 100 % (06/12 0900) Arterial Line BP: (77-178)/(39-80) 120/54 mmHg (06/12 0900) FiO2 (%):  [40 %] 40 % (06/12 0900) Weight:  [80 kg (176 lb 5.9 oz)] 80 kg (176 lb 5.9 oz) (06/12 0400) Last BM Date:  (PTA)  Intake/Output from previous day: 06/11 0701 - 06/12 0700 In: 1399.5 [I.V.:588.8; NG/GT:210; IV Piggyback:100; TPN:500.7] Out: 5060 [Urine:3160; Emesis/NG output:1850; Drains:50] Intake/Output this shift: Total I/O In: 130 [I.V.:20; NG/GT:30; TPN:80] Out: 150 [Urine:150]  General: No responsiveness.  Lungs: Clear  Abd: Soft, not apparent tenderness.  No bowel sounds.  Extremities: No changes  Neuro: Somnolent, lethargic, in coma  Lab Results:  @LABLAST2 (wbc:2,hgb:2,hct:2,plt:2) BMET ) Recent Labs  02/05/16 0400 02/06/16 0418  NA 137 138  K 4.3 3.8  CL 114* 110  CO2 19* 20*  GLUCOSE 146* 132*  BUN 19 32*  CREATININE 0.80 1.04  CALCIUM 7.1* 7.1*   PT/INR No results for input(s): LABPROT, INR in the last 72 hours. ABG  Recent Labs  02/04/16 0418 02/06/16 0406  PHART 7.383 7.499*  HCO3 19.4* 23.4    Studies/Results: Mr Brain Wo Contrast  02/05/2016  CLINICAL DATA:  Multifocal embolic infarcts, splenic and bowel infarcts. Assess for further embolic ischemia. Worsening leg weakness. EXAM: MRI HEAD WITHOUT CONTRAST TECHNIQUE: Multiplanar, multiecho pulse sequences of the brain and surrounding structures were obtained without intravenous contrast. COMPARISON:  CT HEAD Jan 25, 2016 FINDINGS: INTRACRANIAL CONTENTS: Patchy reduced  diffusion in bilateral cerebellum, vermis. Confluent reduced diffusion within frontal, parietal, occipital lobes. Extension of reduced diffusion to RIGHT posterior temporal lobe. Patchy reduced diffusion splenium of the corpus callosum. Patchy reduced diffuse in LEFT posterior temporal lobe. Patchy reduced diffusion LEFT basal ganglia. All areas of reduced diffusion demonstrate low ADC values. Patchy areas of susceptibility artifact within areas of infarct associated with T1 shortening. Local sulcal effacement. 3 mm RIGHT to LEFT midline shift with mass effect on RIGHT lateral ventricle atrium. No ventricular entrapment or hydrocephalus. No abnormal extra-axial fluid collections. Major intracranial vascular flow voids present at skull base. ORBITS: Trace RIGHT maxillary sinus mucosal thickening. Small bilateral mastoid effusions. SINUSES: The mastoid air-cells and included paranasal sinuses are well-aerated. SKULL/SOFT TISSUES: No abnormal sellar expansion. No suspicious calvarial bone marrow signal. Craniocervical junction maintained. IMPRESSION: Extensive supra and infratentorial acute infarcts, including RIGHT greater than LEFT MCA and PCA territories consistent with embolic phenomena. Petechial hemorrhage without frank hemorrhagic conversion. 3 mm RIGHT to LEFT midline shift without ventricular entrapment. These results will be called to the ordering clinician or representative by the Radiologist Assistant, and communication documented in the PACS or zVision Dashboard. Electronically Signed   By: Awilda Metroourtnay  Bloomer M.D.   On: 02/05/2016 23:24   Mr Cervical Spine Wo Contrast  02/06/2016  CLINICAL DATA:  Extensive cerebral and abdominal infarcts including ischemic bowel. Leg weakness. EXAM: MRI CERVICAL, THORACIC SPINE WITHOUT CONTRAST TECHNIQUE: Multiplanar and multiecho pulse sequences of the cervical spine, to include the craniocervical junction and cervicothoracic junction, and thoracic spine, were obtained  without intravenous contrast. COMPARISON:  None. FINDINGS: MRI CERVICAL  SPINE FINDINGS ALIGNMENT: Straightened cervical lordosis.  No malalignment. VERTEBRAE/DISCS: Vertebral bodies are intact. Intervertebral disc morphology's and signal are normal. CORD:Cervical spinal cord is normal morphology and signal characteristics from the cervicomedullary junction to level of T1-2, the most caudal well visualized level. POSTERIOR FOSSA, VERTEBRAL ARTERIES, PARASPINAL TISSUES: No MR findings of ligamentous injury. Vertebral artery flow voids present. Included view of the posterior fossa demonstrates multiple cerebellar infarcts. Subcutaneous fat interstitial edema. DISC LEVELS: Mildly motion degraded examination. C2-3: No disc bulge, canal stenosis nor neural foraminal narrowing. C3-4 and C4-5: Tiny central disc protrusion without canal stenosis or neural foraminal narrowing. C5-6: Minimal annular bulging without canal stenosis neural foraminal narrowing. C6-7: Minimal annular bulging and tiny central disc protrusion without canal stenosis or neural foraminal narrowing. C7-T1: No disc bulge, canal stenosis nor neural foraminal narrowing. MRI THORACIC SPINE FINDINGS ALIGNMENT: Maintenance of the thoracic kyphosis. No malalignment. VERTEBRAE/DISCS: Vertebral bodies are intact. Intervertebral disc morphology's and signal are normal. Scattered chronic Schmorl's nodes. Mild subacute discogenic endplate changes at T6-7, T10-11, T11-12. CORD: Thoracic spinal cord is normal morphology and signal characteristics to the level the conus medullaris which terminates at T12-L1. PREVERTEBRAL AND PARASPINAL SOFT TISSUES: Mild subcutaneous fat interstitial dependent edema. Prominent upper and mid thoracic dorsal epidural fat. Life-support lines in place, not tailored for evaluation. DEGENERATIVE CHANGE: No canal stenosis or neural foraminal narrowing at any level. At T6-7 is small RIGHT central disc protrusion. At T7-8 is small LEFT central  disc protrusion and annular fissure. IMPRESSION: MRI CERVICAL SPINE: No MR findings of spinal cord infarct though diffusion weighted sequences not obtained. Negative noncontrast MRI of the cervical spine. MRI THORACIC SPINE: No MR findings of spinal cord infarct though diffusion-weighted sequences not obtained. Small mid thoracic disc protrusions without canal stenosis, neural foraminal narrowing or acute osseous process. Electronically Signed   By: Awilda Metro M.D.   On: 02/06/2016 00:53   Mr Thoracic Spine Wo Contrast  02/06/2016  CLINICAL DATA:  Extensive cerebral and abdominal infarcts including ischemic bowel. Leg weakness. EXAM: MRI CERVICAL, THORACIC SPINE WITHOUT CONTRAST TECHNIQUE: Multiplanar and multiecho pulse sequences of the cervical spine, to include the craniocervical junction and cervicothoracic junction, and thoracic spine, were obtained without intravenous contrast. COMPARISON:  None. FINDINGS: MRI CERVICAL SPINE FINDINGS ALIGNMENT: Straightened cervical lordosis.  No malalignment. VERTEBRAE/DISCS: Vertebral bodies are intact. Intervertebral disc morphology's and signal are normal. CORD:Cervical spinal cord is normal morphology and signal characteristics from the cervicomedullary junction to level of T1-2, the most caudal well visualized level. POSTERIOR FOSSA, VERTEBRAL ARTERIES, PARASPINAL TISSUES: No MR findings of ligamentous injury. Vertebral artery flow voids present. Included view of the posterior fossa demonstrates multiple cerebellar infarcts. Subcutaneous fat interstitial edema. DISC LEVELS: Mildly motion degraded examination. C2-3: No disc bulge, canal stenosis nor neural foraminal narrowing. C3-4 and C4-5: Tiny central disc protrusion without canal stenosis or neural foraminal narrowing. C5-6: Minimal annular bulging without canal stenosis neural foraminal narrowing. C6-7: Minimal annular bulging and tiny central disc protrusion without canal stenosis or neural foraminal  narrowing. C7-T1: No disc bulge, canal stenosis nor neural foraminal narrowing. MRI THORACIC SPINE FINDINGS ALIGNMENT: Maintenance of the thoracic kyphosis. No malalignment. VERTEBRAE/DISCS: Vertebral bodies are intact. Intervertebral disc morphology's and signal are normal. Scattered chronic Schmorl's nodes. Mild subacute discogenic endplate changes at T6-7, T10-11, T11-12. CORD: Thoracic spinal cord is normal morphology and signal characteristics to the level the conus medullaris which terminates at T12-L1. PREVERTEBRAL AND PARASPINAL SOFT TISSUES: Mild subcutaneous fat interstitial dependent edema. Prominent upper and  mid thoracic dorsal epidural fat. Life-support lines in place, not tailored for evaluation. DEGENERATIVE CHANGE: No canal stenosis or neural foraminal narrowing at any level. At T6-7 is small RIGHT central disc protrusion. At T7-8 is small LEFT central disc protrusion and annular fissure. IMPRESSION: MRI CERVICAL SPINE: No MR findings of spinal cord infarct though diffusion weighted sequences not obtained. Negative noncontrast MRI of the cervical spine. MRI THORACIC SPINE: No MR findings of spinal cord infarct though diffusion-weighted sequences not obtained. Small mid thoracic disc protrusions without canal stenosis, neural foraminal narrowing or acute osseous process. Electronically Signed   By: Courtnay  Awilda Metro On: 02/06/2016 00:53   Dg Chest Portable 1 View  02/06/2016  CLINICAL DATA:  Respiratory failure. EXAM: PORTABLE CHEST 1 VIEW COMPARISON:  02/05/2016. FINDINGS: Endotracheal tube, NG tube, right IJ line stable position. Cardiomegaly with persistent bilateral pulmonary interstitial prominence consistent with congestive heart failure. Small left pleural effusion. No pneumothorax . IMPRESSION: 1. Lines and tubes in stable position. 2.Persistent changes of congestive heart failure with bilateral pulmonary interstitial edema and small left pleural effusion. No significant change from  prior exam. Electronically Signed   By: Maisie Fus  Register   On: 02/06/2016 07:41   Dg Chest Portable 1 View  02/05/2016  CLINICAL DATA:  Respiratory failure EXAM: PORTABLE CHEST 1 VIEW COMPARISON:  02/04/2016 FINDINGS: Cardiomegaly again noted. Stable endotracheal and NG tube position. Right IJ central line is unchanged in position. Mild interstitial prominence bilaterally suspicious for mild interstitial edema. Persistent bilateral lower lobe atelectasis or infiltrate left greater than right. Atherosclerotic calcifications of thoracic aorta. IMPRESSION: Stable endotracheal and NG tube position. Right IJ central line is unchanged in position. Mild interstitial prominence bilaterally suspicious for mild interstitial edema. Persistent bilateral lower lobe atelectasis or infiltrate left greater than right. Electronically Signed   By: Natasha Mead M.D.   On: 02/05/2016 09:53    Anti-infectives: Anti-infectives    Start     Dose/Rate Route Frequency Ordered Stop   01/31/16 0400  ertapenem (INVANZ) 1 g in sodium chloride 0.9 % 50 mL IVPB     1 g 100 mL/hr over 30 Minutes Intravenous Every 24 hours 01/31/16 0334     01/31/16 0100  [MAR Hold]  cefOXitin (MEFOXIN) 2 g in dextrose 5 % 50 mL IVPB     (MAR Hold since 01/31/16 0049)   2 g 100 mL/hr over 30 Minutes Intravenous To Surgery 01/31/16 0046 01/31/16 0118      Assessment/Plan: s/p Procedure(s): EXPLORATORY LAPAROTOMY SMALL BOWEL RESECTION Fevers and slight increase in WBC, cannot tell if there is an abdomnal source.  In light of his recently diagnosed major stroke, not sure of prognosis.  No tube feedings for now.  LOS: 7 days   Marta Lamas. Gae Bon, MD, FACS [phone] [facility] Surgery 02/06/2016

## 2016-02-06 NOTE — Progress Notes (Signed)
Bedside EEG completed, results pending. 

## 2016-02-07 ENCOUNTER — Inpatient Hospital Stay (HOSPITAL_COMMUNITY): Payer: BLUE CROSS/BLUE SHIELD

## 2016-02-07 DIAGNOSIS — I6529 Occlusion and stenosis of unspecified carotid artery: Secondary | ICD-10-CM

## 2016-02-07 DIAGNOSIS — M79609 Pain in unspecified limb: Secondary | ICD-10-CM

## 2016-02-07 DIAGNOSIS — M7989 Other specified soft tissue disorders: Secondary | ICD-10-CM

## 2016-02-07 DIAGNOSIS — I6789 Other cerebrovascular disease: Secondary | ICD-10-CM

## 2016-02-07 LAB — GLUCOSE, CAPILLARY
GLUCOSE-CAPILLARY: 156 mg/dL — AB (ref 65–99)
Glucose-Capillary: 136 mg/dL — ABNORMAL HIGH (ref 65–99)
Glucose-Capillary: 141 mg/dL — ABNORMAL HIGH (ref 65–99)
Glucose-Capillary: 142 mg/dL — ABNORMAL HIGH (ref 65–99)
Glucose-Capillary: 144 mg/dL — ABNORMAL HIGH (ref 65–99)
Glucose-Capillary: 150 mg/dL — ABNORMAL HIGH (ref 65–99)

## 2016-02-07 LAB — CBC
HEMATOCRIT: 30.7 % — AB (ref 39.0–52.0)
HEMOGLOBIN: 10.5 g/dL — AB (ref 13.0–17.0)
MCH: 29.6 pg (ref 26.0–34.0)
MCHC: 34.2 g/dL (ref 30.0–36.0)
MCV: 86.5 fL (ref 78.0–100.0)
Platelets: 62 10*3/uL — ABNORMAL LOW (ref 150–400)
RBC: 3.55 MIL/uL — ABNORMAL LOW (ref 4.22–5.81)
RDW: 14.8 % (ref 11.5–15.5)
WBC: 22.3 10*3/uL — ABNORMAL HIGH (ref 4.0–10.5)

## 2016-02-07 LAB — COMPREHENSIVE METABOLIC PANEL
ALK PHOS: 114 U/L (ref 38–126)
ALT: 134 U/L — AB (ref 17–63)
AST: 225 U/L — AB (ref 15–41)
Albumin: 1.6 g/dL — ABNORMAL LOW (ref 3.5–5.0)
Anion gap: 12 (ref 5–15)
BUN: 34 mg/dL — ABNORMAL HIGH (ref 6–20)
CHLORIDE: 101 mmol/L (ref 101–111)
CO2: 25 mmol/L (ref 22–32)
Calcium: 7.8 mg/dL — ABNORMAL LOW (ref 8.9–10.3)
Creatinine, Ser: 1.12 mg/dL (ref 0.61–1.24)
GFR calc Af Amer: >60 mL/min (ref >60)
GFR calc non Af Amer: >60 mL/min (ref >60)
GLUCOSE: 154 mg/dL — AB (ref 65–99)
POTASSIUM: 2.7 mmol/L — AB (ref 3.5–5.1)
SODIUM: 138 mmol/L (ref 135–145)
TOTAL PROTEIN: 6 g/dL — AB (ref 6.5–8.1)
Total Bilirubin: 3.9 mg/dL — ABNORMAL HIGH (ref 0.3–1.2)

## 2016-02-07 LAB — BASIC METABOLIC PANEL
ANION GAP: 7 (ref 5–15)
Anion gap: 10 (ref 5–15)
BUN: 36 mg/dL — ABNORMAL HIGH (ref 6–20)
BUN: 38 mg/dL — ABNORMAL HIGH (ref 6–20)
CALCIUM: 7.6 mg/dL — AB (ref 8.9–10.3)
CO2: 29 mmol/L (ref 22–32)
CO2: 29 mmol/L (ref 22–32)
CREATININE: 1.09 mg/dL (ref 0.61–1.24)
Calcium: 7.4 mg/dL — ABNORMAL LOW (ref 8.9–10.3)
Chloride: 98 mmol/L — ABNORMAL LOW (ref 101–111)
Chloride: 98 mmol/L — ABNORMAL LOW (ref 101–111)
Creatinine, Ser: 1 mg/dL (ref 0.61–1.24)
GFR calc non Af Amer: >60 mL/min (ref >60)
Glucose, Bld: 151 mg/dL — ABNORMAL HIGH (ref 65–99)
Glucose, Bld: 132 mg/dL — ABNORMAL HIGH (ref 65–99)
POTASSIUM: 3.1 mmol/L — AB (ref 3.5–5.1)
Potassium: 2.8 mmol/L — ABNORMAL LOW (ref 3.5–5.1)
SODIUM: 134 mmol/L — AB (ref 135–145)
Sodium: 137 mmol/L (ref 135–145)

## 2016-02-07 LAB — BLOOD GAS, ARTERIAL
ACID-BASE EXCESS: 2.7 mmol/L — AB (ref 0.0–2.0)
Bicarbonate: 24.6 mEq/L — ABNORMAL HIGH (ref 20.0–24.0)
Drawn by: 41308
FIO2: 0.4
LHR: 14 {breaths}/min
MECHVT: 570 mL
O2 Saturation: 99.3 %
PEEP/CPAP: 5 cmH2O
Patient temperature: 98.6
TCO2: 25.3 mmol/L (ref 0–100)
pCO2 arterial: 24.8 mmHg — ABNORMAL LOW (ref 35.0–45.0)
pH, Arterial: 7.602 (ref 7.350–7.450)
pO2, Arterial: 162 mmHg — ABNORMAL HIGH (ref 80.0–100.0)

## 2016-02-07 LAB — ANTINUCLEAR ANTIBODIES, IFA: ANTINUCLEAR ANTIBODIES, IFA: NEGATIVE

## 2016-02-07 LAB — TYPE AND SCREEN
ABO/RH(D): A POS
ANTIBODY SCREEN: NEGATIVE
UNIT DIVISION: 0
Unit division: 0

## 2016-02-07 LAB — PHOSPHORUS: PHOSPHORUS: 5 mg/dL — AB (ref 2.5–4.6)

## 2016-02-07 LAB — ECHOCARDIOGRAM COMPLETE BUBBLE STUDY
E/e' ratio: 3.56
EWDT: 129 ms
FS: 31 % (ref 28–44)
IV/PV OW: 0.89
LA ID, A-P, ES: 35 mm
LADIAMINDEX: 1.76 cm/m2
LAVOLA4C: 28 mL
LEFT ATRIUM END SYS DIAM: 35 mm
LV E/e' medial: 3.56
LV TDI E'LATERAL: 15.1
LV TDI E'MEDIAL: 8.7
LV e' LATERAL: 15.1 cm/s
LVEEAVG: 3.56
LVOT area: 3.46 cm2
LVOTD: 21 mm
MV Dec: 129
MV VTI: 108 cm
MV pk A vel: 44.6 m/s
MV pk E vel: 53.8 m/s
PISA EROA: 0.12 cm2
PW: 11.6 mm — AB (ref 0.6–1.1)
RV LATERAL S' VELOCITY: 13.1 cm/s
TAPSE: 16.5 mm

## 2016-02-07 LAB — POCT I-STAT 3, ART BLOOD GAS (G3+)
ACID-BASE EXCESS: 10 mmol/L — AB (ref 0.0–2.0)
BICARBONATE: 31.7 meq/L — AB (ref 20.0–24.0)
O2 Saturation: 100 %
TCO2: 33 mmol/L (ref 0–100)
pCO2 arterial: 32.9 mmHg — ABNORMAL LOW (ref 35.0–45.0)
pH, Arterial: 7.593 — ABNORMAL HIGH (ref 7.350–7.450)
pO2, Arterial: 169 mmHg — ABNORMAL HIGH (ref 80.0–100.0)

## 2016-02-07 LAB — C3 COMPLEMENT: C3 Complement: 171 mg/dL — ABNORMAL HIGH (ref 82–167)

## 2016-02-07 LAB — TROPONIN I
Troponin I: 3.34 ng/mL (ref <0.031)
Troponin I: 3.08 ng/mL (ref <0.031)
Troponin I: 2.72 ng/mL (ref <0.031)

## 2016-02-07 LAB — COMPLEMENT, TOTAL

## 2016-02-07 LAB — MAGNESIUM: Magnesium: 2.3 mg/dL (ref 1.7–2.4)

## 2016-02-07 LAB — C4 COMPLEMENT: Complement C4, Body Fluid: 54 mg/dL — ABNORMAL HIGH (ref 14–44)

## 2016-02-07 LAB — MISC LABCORP TEST (SEND OUT): LABCORP TEST CODE: 502251

## 2016-02-07 LAB — HIV ANTIBODY (ROUTINE TESTING W REFLEX): HIV SCREEN 4TH GENERATION: NONREACTIVE

## 2016-02-07 LAB — LACTIC ACID, PLASMA: LACTIC ACID, VENOUS: 1.3 mmol/L (ref 0.5–2.0)

## 2016-02-07 MED ORDER — SODIUM CHLORIDE 0.9 % IV SOLN
0.0000 ug/h | INTRAVENOUS | Status: DC
  Administered 2016-02-07: 50 ug/h via INTRAVENOUS
  Administered 2016-02-08: 100 ug/h via INTRAVENOUS
  Filled 2016-02-07 (×2): qty 50

## 2016-02-07 MED ORDER — POTASSIUM CHLORIDE 10 MEQ/50ML IV SOLN
10.0000 meq | INTRAVENOUS | Status: AC
  Administered 2016-02-07 – 2016-02-08 (×6): 10 meq via INTRAVENOUS
  Filled 2016-02-07: qty 50

## 2016-02-07 MED ORDER — ENOXAPARIN SODIUM 40 MG/0.4ML ~~LOC~~ SOLN: 40.0000 mg | SUBCUTANEOUS | Status: DC

## 2016-02-07 MED ORDER — POTASSIUM CHLORIDE 10 MEQ/50ML IV SOLN
10.0000 meq | INTRAVENOUS | Status: AC
  Administered 2016-02-07 (×8): 10 meq via INTRAVENOUS
  Filled 2016-02-07 (×5): qty 50

## 2016-02-07 MED ORDER — CLINIMIX E/DEXTROSE (5/15) 5 % IV SOLN
INTRAVENOUS | Status: DC
  Administered 2016-02-07: 18:00:00 via INTRAVENOUS
  Filled 2016-02-07: qty 1440

## 2016-02-07 NOTE — Progress Notes (Signed)
[facility] Progress Note 4 Days Post-Op  Subjective: Patietn had two bowel movements in the last two days.  Not awakening very much at all.  Objective: Vital signs in last 24 hours: Temp:  [99.2 F (37.3 C)-102.5 F (39.2 C)] 101.4 F (38.6 C) (06/13 0403) Pulse Rate:  [70-113] 88 (06/13 0743) Resp:  [14-32] 16 (06/13 0743) BP: (96-159)/(66-136) 104/66 mmHg (06/13 0743) SpO2:  [99 %-100 %] 100 % (06/13 0743) Arterial Line BP: (105-178)/(53-87) 113/61 mmHg (06/13 0700) FiO2 (%):  [40 %] 40 % (06/13 0743) Last BM Date: 02/06/16  Intake/Output from previous day: 06/12 0701 - 06/13 0700 In: 2242.2 [I.V.:232.2; NG/GT:240; IV Piggyback:550; TPN:1220] Out: 7235 [Urine:5585; Emesis/NG output:1600; Drains:50] Intake/Output this shift: Total I/O In: 50 [IV Piggyback:50] Out: -   General: No acute distress. On vent, on Fentanyl sedation  Lungs: Clear  Abd: Soft, no bowel sounds.  Wound healing well in midline  Extremities: No changes  Neuro: Comatose  Lab Results:  @LABLAST2 (wbc:2,hgb:2,hct:2,plt:2) BMET ) Recent Labs  02/06/16 0418 02/07/16 0358  NA 138 138  K 3.8 2.7*  CL 110 101  CO2 20* 25  GLUCOSE 132* 154*  BUN 32* 34*  CREATININE 1.04 1.12  CALCIUM 7.1* 7.8*   PT/INR No results for input(s): LABPROT, INR in the last 72 hours. ABG  Recent Labs  02/06/16 0406 02/07/16 0337  PHART 7.499* 7.602*  HCO3 23.4 24.6*    Studies/Results: Ct Angio Head W/cm &/or Wo Cm  02/06/2016  CLINICAL DATA:  Stroke. EXAM: CT ANGIOGRAPHY HEAD AND NECK TECHNIQUE: Multidetector CT imaging of the head and neck was performed using the standard protocol during bolus administration of intravenous contrast. Multiplanar CT image reconstructions and MIPs were obtained to evaluate the vascular anatomy. Carotid stenosis measurements (when applicable) are obtained utilizing NASCET criteria, using the distal internal carotid diameter as the denominator. CONTRAST:  50 mL Isovue 370 IV  COMPARISON:  MRI head 02/05/2016 FINDINGS: CT HEAD Brain: Large territory subacute infarct in the right MCA territory similar to the prior MRI. This extends into the right occipital lobe in the right PCA territory. Subacute infarct in the left MCA territory over the convexity. This may be a watershed infarct. Subacute infarct left occipital lobe. Multiple areas of subacute infarction in the cerebellum bilaterally. Subacute infarct left caudate. These are all similar to the recent MRI. No associated acute hemorrhage. No shift of the midline structures Calvarium and skull base: Negative Paranasal sinuses: Clear. The patient is intubated. NG tube in the esophagus. Orbits: No orbital lesion. CTA NECK Aortic arch: Aortic arch not imaged. Proximal great vessels are patent with diffuse atherosclerotic calcification. Ground-glass airspace disease bilaterally likely due to mild edema. Right carotid system: Right common carotid artery widely patent. Atherosclerotic calcification of the right carotid bifurcation. 50% diameter stenosis proximal right internal carotid artery. Right external carotid artery widely patent. Left carotid system: Left common carotid artery widely patent. Atherosclerotic calcification left carotid bifurcation. 75% stenosis proximal left internal carotid artery. Mild stenosis external carotid artery on the left. Vertebral arteries:Atherosclerotic calcification is seen scattered throughout both vertebral arteries. Moderately severe calcific stenosis origin right vertebral artery. Mild stenosis origin left vertebral artery. Moderate stenosis distal left vertebral artery due to calcific stenosis. Skeleton: Negative Other neck: Negative for mass or adenopathy in the neck. CTA HEAD Anterior circulation: Extensive atherosclerotic calcification throughout the cavernous carotid artery bilaterally with moderate to severe stenosis bilaterally in the cavernous carotid. Anterior and middle cerebral arteries patent  bilaterally. Decreased flow in right  middle cerebral artery branches due to subacute infarction. No filling defect. No large vessel occlusion. Posterior circulation: Moderate stenosis distal left vertebral artery. PICA patent bilaterally. Basilar patent. Superior cerebellar and posterior cerebral arteries patent bilaterally without stenosis. Venous sinuses: Patent Anatomic variants: Negative for cerebral aneurysm. Delayed phase: Normal enhancement postcontrast administration. IMPRESSION: Multiple areas of subacute infarct in both cerebral hemispheres and both cerebellar hemispheres. These are similar in distribution to the recent MRI yesterday. No acute hemorrhage 50% diameter stenosis proximal right internal carotid artery due to calcific stenosis. Moderate to severe stenosis in the right cavernous carotid due to calcific stenosis 75% diameter stenosis proximal left internal carotid artery due to calcific stenosis. Moderate to severe stenosis left cavernous carotid due to calcific stenosis Moderate stenosis at the origin of the right vertebral artery. Moderate stenosis distal left vertebral artery. Scattered atherosclerotic disease throughout both vertebral arteries. No large vessel intracranial occlusion. Decrease flow in the right middle cerebral artery branches due to large territory subacute infarction. Electronically Signed   By: Marlan Palau M.D.   On: 02/06/2016 17:37   Ct Angio Neck W/cm &/or Wo/cm  02/06/2016  CLINICAL DATA:  Stroke. EXAM: CT ANGIOGRAPHY HEAD AND NECK TECHNIQUE: Multidetector CT imaging of the head and neck was performed using the standard protocol during bolus administration of intravenous contrast. Multiplanar CT image reconstructions and MIPs were obtained to evaluate the vascular anatomy. Carotid stenosis measurements (when applicable) are obtained utilizing NASCET criteria, using the distal internal carotid diameter as the denominator. CONTRAST:  50 mL Isovue 370 IV COMPARISON:   MRI head 02/05/2016 FINDINGS: CT HEAD Brain: Large territory subacute infarct in the right MCA territory similar to the prior MRI. This extends into the right occipital lobe in the right PCA territory. Subacute infarct in the left MCA territory over the convexity. This may be a watershed infarct. Subacute infarct left occipital lobe. Multiple areas of subacute infarction in the cerebellum bilaterally. Subacute infarct left caudate. These are all similar to the recent MRI. No associated acute hemorrhage. No shift of the midline structures Calvarium and skull base: Negative Paranasal sinuses: Clear. The patient is intubated. NG tube in the esophagus. Orbits: No orbital lesion. CTA NECK Aortic arch: Aortic arch not imaged. Proximal great vessels are patent with diffuse atherosclerotic calcification. Ground-glass airspace disease bilaterally likely due to mild edema. Right carotid system: Right common carotid artery widely patent. Atherosclerotic calcification of the right carotid bifurcation. 50% diameter stenosis proximal right internal carotid artery. Right external carotid artery widely patent. Left carotid system: Left common carotid artery widely patent. Atherosclerotic calcification left carotid bifurcation. 75% stenosis proximal left internal carotid artery. Mild stenosis external carotid artery on the left. Vertebral arteries:Atherosclerotic calcification is seen scattered throughout both vertebral arteries. Moderately severe calcific stenosis origin right vertebral artery. Mild stenosis origin left vertebral artery. Moderate stenosis distal left vertebral artery due to calcific stenosis. Skeleton: Negative Other neck: Negative for mass or adenopathy in the neck. CTA HEAD Anterior circulation: Extensive atherosclerotic calcification throughout the cavernous carotid artery bilaterally with moderate to severe stenosis bilaterally in the cavernous carotid. Anterior and middle cerebral arteries patent bilaterally.  Decreased flow in right middle cerebral artery branches due to subacute infarction. No filling defect. No large vessel occlusion. Posterior circulation: Moderate stenosis distal left vertebral artery. PICA patent bilaterally. Basilar patent. Superior cerebellar and posterior cerebral arteries patent bilaterally without stenosis. Venous sinuses: Patent Anatomic variants: Negative for cerebral aneurysm. Delayed phase: Normal enhancement postcontrast administration. IMPRESSION: Multiple areas of subacute infarct  in both cerebral hemispheres and both cerebellar hemispheres. These are similar in distribution to the recent MRI yesterday. No acute hemorrhage 50% diameter stenosis proximal right internal carotid artery due to calcific stenosis. Moderate to severe stenosis in the right cavernous carotid due to calcific stenosis 75% diameter stenosis proximal left internal carotid artery due to calcific stenosis. Moderate to severe stenosis left cavernous carotid due to calcific stenosis Moderate stenosis at the origin of the right vertebral artery. Moderate stenosis distal left vertebral artery. Scattered atherosclerotic disease throughout both vertebral arteries. No large vessel intracranial occlusion. Decrease flow in the right middle cerebral artery branches due to large territory subacute infarction. Electronically Signed   By: Marlan Palauharles  Clark M.D.   On: 02/06/2016 17:37   Mr Brain Wo Contrast  02/05/2016  CLINICAL DATA:  Multifocal embolic infarcts, splenic and bowel infarcts. Assess for further embolic ischemia. Worsening leg weakness. EXAM: MRI HEAD WITHOUT CONTRAST TECHNIQUE: Multiplanar, multiecho pulse sequences of the brain and surrounding structures were obtained without intravenous contrast. COMPARISON:  CT HEAD Jan 25, 2016 FINDINGS: INTRACRANIAL CONTENTS: Patchy reduced diffusion in bilateral cerebellum, vermis. Confluent reduced diffusion within frontal, parietal, occipital lobes. Extension of reduced  diffusion to RIGHT posterior temporal lobe. Patchy reduced diffusion splenium of the corpus callosum. Patchy reduced diffuse in LEFT posterior temporal lobe. Patchy reduced diffusion LEFT basal ganglia. All areas of reduced diffusion demonstrate low ADC values. Patchy areas of susceptibility artifact within areas of infarct associated with T1 shortening. Local sulcal effacement. 3 mm RIGHT to LEFT midline shift with mass effect on RIGHT lateral ventricle atrium. No ventricular entrapment or hydrocephalus. No abnormal extra-axial fluid collections. Major intracranial vascular flow voids present at skull base. ORBITS: Trace RIGHT maxillary sinus mucosal thickening. Small bilateral mastoid effusions. SINUSES: The mastoid air-cells and included paranasal sinuses are well-aerated. SKULL/SOFT TISSUES: No abnormal sellar expansion. No suspicious calvarial bone marrow signal. Craniocervical junction maintained. IMPRESSION: Extensive supra and infratentorial acute infarcts, including RIGHT greater than LEFT MCA and PCA territories consistent with embolic phenomena. Petechial hemorrhage without frank hemorrhagic conversion. 3 mm RIGHT to LEFT midline shift without ventricular entrapment. These results will be called to the ordering clinician or representative by the Radiologist Assistant, and communication documented in the PACS or zVision Dashboard. Electronically Signed   By: Awilda Metroourtnay  Bloomer M.D.   On: 02/05/2016 23:24   Mr Cervical Spine Wo Contrast  02/06/2016  CLINICAL DATA:  Extensive cerebral and abdominal infarcts including ischemic bowel. Leg weakness. EXAM: MRI CERVICAL, THORACIC SPINE WITHOUT CONTRAST TECHNIQUE: Multiplanar and multiecho pulse sequences of the cervical spine, to include the craniocervical junction and cervicothoracic junction, and thoracic spine, were obtained without intravenous contrast. COMPARISON:  None. FINDINGS: MRI CERVICAL SPINE FINDINGS ALIGNMENT: Straightened cervical lordosis.  No  malalignment. VERTEBRAE/DISCS: Vertebral bodies are intact. Intervertebral disc morphology's and signal are normal. CORD:Cervical spinal cord is normal morphology and signal characteristics from the cervicomedullary junction to level of T1-2, the most caudal well visualized level. POSTERIOR FOSSA, VERTEBRAL ARTERIES, PARASPINAL TISSUES: No MR findings of ligamentous injury. Vertebral artery flow voids present. Included view of the posterior fossa demonstrates multiple cerebellar infarcts. Subcutaneous fat interstitial edema. DISC LEVELS: Mildly motion degraded examination. C2-3: No disc bulge, canal stenosis nor neural foraminal narrowing. C3-4 and C4-5: Tiny central disc protrusion without canal stenosis or neural foraminal narrowing. C5-6: Minimal annular bulging without canal stenosis neural foraminal narrowing. C6-7: Minimal annular bulging and tiny central disc protrusion without canal stenosis or neural foraminal narrowing. C7-T1: No disc bulge, canal stenosis  nor neural foraminal narrowing. MRI THORACIC SPINE FINDINGS ALIGNMENT: Maintenance of the thoracic kyphosis. No malalignment. VERTEBRAE/DISCS: Vertebral bodies are intact. Intervertebral disc morphology's and signal are normal. Scattered chronic Schmorl's nodes. Mild subacute discogenic endplate changes at T6-7, T10-11, T11-12. CORD: Thoracic spinal cord is normal morphology and signal characteristics to the level the conus medullaris which terminates at T12-L1. PREVERTEBRAL AND PARASPINAL SOFT TISSUES: Mild subcutaneous fat interstitial dependent edema. Prominent upper and mid thoracic dorsal epidural fat. Life-support lines in place, not tailored for evaluation. DEGENERATIVE CHANGE: No canal stenosis or neural foraminal narrowing at any level. At T6-7 is small RIGHT central disc protrusion. At T7-8 is small LEFT central disc protrusion and annular fissure. IMPRESSION: MRI CERVICAL SPINE: No MR findings of spinal cord infarct though diffusion weighted  sequences not obtained. Negative noncontrast MRI of the cervical spine. MRI THORACIC SPINE: No MR findings of spinal cord infarct though diffusion-weighted sequences not obtained. Small mid thoracic disc protrusions without canal stenosis, neural foraminal narrowing or acute osseous process. Electronically Signed   By: Awilda Metro M.D.   On: 02/06/2016 00:53   Mr Thoracic Spine Wo Contrast  02/06/2016  CLINICAL DATA:  Extensive cerebral and abdominal infarcts including ischemic bowel. Leg weakness. EXAM: MRI CERVICAL, THORACIC SPINE WITHOUT CONTRAST TECHNIQUE: Multiplanar and multiecho pulse sequences of the cervical spine, to include the craniocervical junction and cervicothoracic junction, and thoracic spine, were obtained without intravenous contrast. COMPARISON:  None. FINDINGS: MRI CERVICAL SPINE FINDINGS ALIGNMENT: Straightened cervical lordosis.  No malalignment. VERTEBRAE/DISCS: Vertebral bodies are intact. Intervertebral disc morphology's and signal are normal. CORD:Cervical spinal cord is normal morphology and signal characteristics from the cervicomedullary junction to level of T1-2, the most caudal well visualized level. POSTERIOR FOSSA, VERTEBRAL ARTERIES, PARASPINAL TISSUES: No MR findings of ligamentous injury. Vertebral artery flow voids present. Included view of the posterior fossa demonstrates multiple cerebellar infarcts. Subcutaneous fat interstitial edema. DISC LEVELS: Mildly motion degraded examination. C2-3: No disc bulge, canal stenosis nor neural foraminal narrowing. C3-4 and C4-5: Tiny central disc protrusion without canal stenosis or neural foraminal narrowing. C5-6: Minimal annular bulging without canal stenosis neural foraminal narrowing. C6-7: Minimal annular bulging and tiny central disc protrusion without canal stenosis or neural foraminal narrowing. C7-T1: No disc bulge, canal stenosis nor neural foraminal narrowing. MRI THORACIC SPINE FINDINGS ALIGNMENT: Maintenance of the  thoracic kyphosis. No malalignment. VERTEBRAE/DISCS: Vertebral bodies are intact. Intervertebral disc morphology's and signal are normal. Scattered chronic Schmorl's nodes. Mild subacute discogenic endplate changes at T6-7, T10-11, T11-12. CORD: Thoracic spinal cord is normal morphology and signal characteristics to the level the conus medullaris which terminates at T12-L1. PREVERTEBRAL AND PARASPINAL SOFT TISSUES: Mild subcutaneous fat interstitial dependent edema. Prominent upper and mid thoracic dorsal epidural fat. Life-support lines in place, not tailored for evaluation. DEGENERATIVE CHANGE: No canal stenosis or neural foraminal narrowing at any level. At T6-7 is small RIGHT central disc protrusion. At T7-8 is small LEFT central disc protrusion and annular fissure. IMPRESSION: MRI CERVICAL SPINE: No MR findings of spinal cord infarct though diffusion weighted sequences not obtained. Negative noncontrast MRI of the cervical spine. MRI THORACIC SPINE: No MR findings of spinal cord infarct though diffusion-weighted sequences not obtained. Small mid thoracic disc protrusions without canal stenosis, neural foraminal narrowing or acute osseous process. Electronically Signed   By: Awilda Metro M.D.   On: 02/06/2016 00:53   Dg Chest Port 1 View  02/07/2016  CLINICAL DATA:  Respiratory failure. EXAM: PORTABLE CHEST 1 VIEW COMPARISON:  02/06/2016. FINDINGS: Endotracheal  tube, NG tube, right IJ line in stable position. Interim clearing of changes of congestive heart failure. Low lung volumes with mild bibasilar atelectasis. No pleural effusion or pneumothorax. IMPRESSION: 1. Lines and tubes in stable position. 2. Interim clearing of changes of congestive heart failure. Low lung volumes with mild bibasilar atelectasis. Electronically Signed   By: Maisie Fus  Register   On: 02/07/2016 07:20   Dg Chest Portable 1 View  02/06/2016  CLINICAL DATA:  Respiratory failure. EXAM: PORTABLE CHEST 1 VIEW COMPARISON:   02/05/2016. FINDINGS: Endotracheal tube, NG tube, right IJ line stable position. Cardiomegaly with persistent bilateral pulmonary interstitial prominence consistent with congestive heart failure. Small left pleural effusion. No pneumothorax . IMPRESSION: 1. Lines and tubes in stable position. 2.Persistent changes of congestive heart failure with bilateral pulmonary interstitial edema and small left pleural effusion. No significant change from prior exam. Electronically Signed   By: Maisie Fus  Register   On: 02/06/2016 07:41    Anti-infectives: Anti-infectives    Start     Dose/Rate Route Frequency Ordered Stop   02/06/16 1600  vancomycin (VANCOCIN) IVPB 1000 mg/200 mL premix     1,000 mg 200 mL/hr over 60 Minutes Intravenous Every 12 hours 02/06/16 1539     01/31/16 0400  ertapenem (INVANZ) 1 g in sodium chloride 0.9 % 50 mL IVPB     1 g 100 mL/hr over 30 Minutes Intravenous Every 24 hours 01/31/16 0334     01/31/16 0100  [MAR Hold]  cefOXitin (MEFOXIN) 2 g in dextrose 5 % 50 mL IVPB     (MAR Hold since 01/31/16 0049)   2 g 100 mL/hr over 30 Minutes Intravenous To Surgery 01/31/16 0046 01/31/16 0118      Assessment/Plan: s/p Procedure(s): EXPLORATORY LAPAROTOMY SMALL BOWEL RESECTION Tough decisions need to be made soon about extending his life in this manner  No changes from surgical standpoint.  Will not start tube feedings yet  LOS: 8 days   Marta Lamas. Gae Bon, MD, FACS [phone] [facility] Surgery 02/07/2016

## 2016-02-07 NOTE — Progress Notes (Signed)
PULMONARY / CRITICAL CARE MEDICINE   Name: Brian Baxter MRN: [id number] DOB: 1955/09/15    ADMISSION DATE:  02/07/2016 CONSULTATION DATE:  01/31/2016  REFERRING MD:  Dr. Arbie Cookey  CHIEF COMPLAINT:  Abdominal pain  HISTORY OF PRESENT ILLNESS:   60 year old male with hx HTN, tobacco abuse admitted 02/24/2016 in the setting of small bowel ischemia and possible right kidney ischemia. He was taken urgently to OR with gen surgery and vascular, abd left open with wound vac.  Tx to ICU intubated, on pressors.  Previous admission to outside hospital 5/31 where MRI brain noted multiple strokes.  During that admit he had extensive w/u to look for etiology of clot which was ess negative including neg TEE.  Current admit - sedation holiday concerning for pt not following commands, became tachypneic and sedation restarted.   SUBJECTIVE:  RN reports pt not following commands on WUA, fever 101.4, ABG with respiratory alkalosis, elevated troponin (3.4 >> 3.34), lactic acid 1.3, K 2.7, WBC 22.3, platelets 62  VITAL SIGNS: BP 113/76 mmHg  Pulse 99  Temp(Src) 101.4 F (38.6 C) (Axillary)  Resp 31  Ht 5\' 9"  (1.753 m)  Wt 176 lb 5.9 oz (80 kg)  BMI 26.03 kg/m2  SpO2 100%  HEMODYNAMICS: CVP:  [2 mmHg-13 mmHg] 2 mmHg  VENTILATOR SETTINGS: Vent Mode:  [-] CPAP;PSV FiO2 (%):  [40 %] 40 % Set Rate:  [14 bmp] 14 bmp Vt Set:  [570 mL] 570 mL PEEP:  [5 cmH20] 5 cmH20 Pressure Support:  [5 cmH20] 5 cmH20 Plateau Pressure:  [15 cmH20-20 cmH20] 15 cmH20  INTAKE / OUTPUT: I/O last 3 completed shifts: In: 3078.8 [I.V.:418.8; NG/GT:360; IV Piggyback:600] Out: 9250 [Urine:6950; Emesis/NG output:2200; Drains:100]  PHYSICAL EXAMINATION: General:  On vent, NAD, unresponsive. Neuro:  RASS -1, does not open eyes, does not follow commands  HEENT:  Normocephalic/atraumatic, endotracheal tube in place Cardiovascular:  Regular rate and rhythm, no murmurs gallops rubs Lungs:  Resps even non labored on vent, clear  anterior, diminished bases  Abdomen:  No bowel sounds, abd incision with VAC  Musculoskeletal:  Normal bulk and tone  LABS:  BMET  Recent Labs Lab 02/05/16 0400 02/06/16 0418 02/07/16 0358  NA 137 138 138  K 4.3 3.8 2.7*  CL 114* 110 101  CO2 19* 20* 25  BUN 19 32* 34*  CREATININE 0.80 1.04 1.12  GLUCOSE 146* 132* 154*   Electrolytes  Recent Labs Lab 02/04/16 0410 02/05/16 0400 02/06/16 0418 02/07/16 0358  CALCIUM  --  7.1* 7.1* 7.8*  MG 1.9  --  2.3 2.3  PHOS 2.9  --  5.8* 5.0*    CBC  Recent Labs Lab 02/05/16 0400 02/06/16 0418 02/07/16 0358  WBC 18.9* 19.6* 22.3*  HGB 9.7* 9.0* 10.5*  HCT 28.6* 26.2* 30.7*  PLT 46* 42* 62*    Coag's  Recent Labs Lab 02/23/2016 0400 02/02/16 0338  APTT 39* 35  INR 1.29 1.43   Sepsis Markers  Recent Labs Lab 02/07/16 0830  LATICACIDVEN 1.3   ABG  Recent Labs Lab 02/04/16 0418 02/06/16 0406 02/07/16 0337  PHART 7.383 7.499* 7.602*  PCO2ART 33.2* 30.0* 24.8*  PO2ART 81.5 89.0 162*   Liver Enzymes  Recent Labs Lab 02/08/2016 1445 02/06/16 0418 02/07/16 0358  AST  --  135* 225*  ALT  --  76* 134*  ALKPHOS  --  79 114  BILITOT  --  3.5* 3.9*  ALBUMIN 1.2* 1.1* 1.6*    Cardiac Enzymes  Recent Labs  Lab 02/06/16 1130 02/07/16 0943  TROPONINI 3.41* 3.34*   Glucose  Recent Labs Lab 02/06/16 1304 02/06/16 1803 02/06/16 2043 02/06/16 2356 02/07/16 0346 02/07/16 0910  GLUCAP 114* 126* 147* 144* 156* 142*   Imaging Ct Angio Head W/cm &/or Wo Cm  02/06/2016  CLINICAL DATA:  Stroke. EXAM: CT ANGIOGRAPHY HEAD AND NECK TECHNIQUE: Multidetector CT imaging of the head and neck was performed using the standard protocol during bolus administration of intravenous contrast. Multiplanar CT image reconstructions and MIPs were obtained to evaluate the vascular anatomy. Carotid stenosis measurements (when applicable) are obtained utilizing NASCET criteria, using the distal internal carotid diameter as the  denominator. CONTRAST:  50 mL Isovue 370 IV COMPARISON:  MRI head 02/05/2016 FINDINGS: CT HEAD Brain: Large territory subacute infarct in the right MCA territory similar to the prior MRI. This extends into the right occipital lobe in the right PCA territory. Subacute infarct in the left MCA territory over the convexity. This may be a watershed infarct. Subacute infarct left occipital lobe. Multiple areas of subacute infarction in the cerebellum bilaterally. Subacute infarct left caudate. These are all similar to the recent MRI. No associated acute hemorrhage. No shift of the midline structures Calvarium and skull base: Negative Paranasal sinuses: Clear. The patient is intubated. NG tube in the esophagus. Orbits: No orbital lesion. CTA NECK Aortic arch: Aortic arch not imaged. Proximal great vessels are patent with diffuse atherosclerotic calcification. Ground-glass airspace disease bilaterally likely due to mild edema. Right carotid system: Right common carotid artery widely patent. Atherosclerotic calcification of the right carotid bifurcation. 50% diameter stenosis proximal right internal carotid artery. Right external carotid artery widely patent. Left carotid system: Left common carotid artery widely patent. Atherosclerotic calcification left carotid bifurcation. 75% stenosis proximal left internal carotid artery. Mild stenosis external carotid artery on the left. Vertebral arteries:Atherosclerotic calcification is seen scattered throughout both vertebral arteries. Moderately severe calcific stenosis origin right vertebral artery. Mild stenosis origin left vertebral artery. Moderate stenosis distal left vertebral artery due to calcific stenosis. Skeleton: Negative Other neck: Negative for mass or adenopathy in the neck. CTA HEAD Anterior circulation: Extensive atherosclerotic calcification throughout the cavernous carotid artery bilaterally with moderate to severe stenosis bilaterally in the cavernous carotid.  Anterior and middle cerebral arteries patent bilaterally. Decreased flow in right middle cerebral artery branches due to subacute infarction. No filling defect. No large vessel occlusion. Posterior circulation: Moderate stenosis distal left vertebral artery. PICA patent bilaterally. Basilar patent. Superior cerebellar and posterior cerebral arteries patent bilaterally without stenosis. Venous sinuses: Patent Anatomic variants: Negative for cerebral aneurysm. Delayed phase: Normal enhancement postcontrast administration. IMPRESSION: Multiple areas of subacute infarct in both cerebral hemispheres and both cerebellar hemispheres. These are similar in distribution to the recent MRI yesterday. No acute hemorrhage 50% diameter stenosis proximal right internal carotid artery due to calcific stenosis. Moderate to severe stenosis in the right cavernous carotid due to calcific stenosis 75% diameter stenosis proximal left internal carotid artery due to calcific stenosis. Moderate to severe stenosis left cavernous carotid due to calcific stenosis Moderate stenosis at the origin of the right vertebral artery. Moderate stenosis distal left vertebral artery. Scattered atherosclerotic disease throughout both vertebral arteries. No large vessel intracranial occlusion. Decrease flow in the right middle cerebral artery branches due to large territory subacute infarction. Electronically Signed   By: Marlan Palau M.D.   On: 02/06/2016 17:37   Ct Angio Neck W/cm &/or Wo/cm  02/06/2016  CLINICAL DATA:  Stroke. EXAM: CT ANGIOGRAPHY HEAD AND NECK  TECHNIQUE: Multidetector CT imaging of the head and neck was performed using the standard protocol during bolus administration of intravenous contrast. Multiplanar CT image reconstructions and MIPs were obtained to evaluate the vascular anatomy. Carotid stenosis measurements (when applicable) are obtained utilizing NASCET criteria, using the distal internal carotid diameter as the denominator.  CONTRAST:  50 mL Isovue 370 IV COMPARISON:  MRI head 02/05/2016 FINDINGS: CT HEAD Brain: Large territory subacute infarct in the right MCA territory similar to the prior MRI. This extends into the right occipital lobe in the right PCA territory. Subacute infarct in the left MCA territory over the convexity. This may be a watershed infarct. Subacute infarct left occipital lobe. Multiple areas of subacute infarction in the cerebellum bilaterally. Subacute infarct left caudate. These are all similar to the recent MRI. No associated acute hemorrhage. No shift of the midline structures Calvarium and skull base: Negative Paranasal sinuses: Clear. The patient is intubated. NG tube in the esophagus. Orbits: No orbital lesion. CTA NECK Aortic arch: Aortic arch not imaged. Proximal great vessels are patent with diffuse atherosclerotic calcification. Ground-glass airspace disease bilaterally likely due to mild edema. Right carotid system: Right common carotid artery widely patent. Atherosclerotic calcification of the right carotid bifurcation. 50% diameter stenosis proximal right internal carotid artery. Right external carotid artery widely patent. Left carotid system: Left common carotid artery widely patent. Atherosclerotic calcification left carotid bifurcation. 75% stenosis proximal left internal carotid artery. Mild stenosis external carotid artery on the left. Vertebral arteries:Atherosclerotic calcification is seen scattered throughout both vertebral arteries. Moderately severe calcific stenosis origin right vertebral artery. Mild stenosis origin left vertebral artery. Moderate stenosis distal left vertebral artery due to calcific stenosis. Skeleton: Negative Other neck: Negative for mass or adenopathy in the neck. CTA HEAD Anterior circulation: Extensive atherosclerotic calcification throughout the cavernous carotid artery bilaterally with moderate to severe stenosis bilaterally in the cavernous carotid. Anterior and  middle cerebral arteries patent bilaterally. Decreased flow in right middle cerebral artery branches due to subacute infarction. No filling defect. No large vessel occlusion. Posterior circulation: Moderate stenosis distal left vertebral artery. PICA patent bilaterally. Basilar patent. Superior cerebellar and posterior cerebral arteries patent bilaterally without stenosis. Venous sinuses: Patent Anatomic variants: Negative for cerebral aneurysm. Delayed phase: Normal enhancement postcontrast administration. IMPRESSION: Multiple areas of subacute infarct in both cerebral hemispheres and both cerebellar hemispheres. These are similar in distribution to the recent MRI yesterday. No acute hemorrhage 50% diameter stenosis proximal right internal carotid artery due to calcific stenosis. Moderate to severe stenosis in the right cavernous carotid due to calcific stenosis 75% diameter stenosis proximal left internal carotid artery due to calcific stenosis. Moderate to severe stenosis left cavernous carotid due to calcific stenosis Moderate stenosis at the origin of the right vertebral artery. Moderate stenosis distal left vertebral artery. Scattered atherosclerotic disease throughout both vertebral arteries. No large vessel intracranial occlusion. Decrease flow in the right middle cerebral artery branches due to large territory subacute infarction. Electronically Signed   By: Marlan Palauharles  Clark M.D.   On: 02/06/2016 17:37   Dg Chest Port 1 View  02/07/2016  CLINICAL DATA:  Respiratory failure. EXAM: PORTABLE CHEST 1 VIEW COMPARISON:  02/06/2016. FINDINGS: Endotracheal tube, NG tube, right IJ line in stable position. Interim clearing of changes of congestive heart failure. Low lung volumes with mild bibasilar atelectasis. No pleural effusion or pneumothorax. IMPRESSION: 1. Lines and tubes in stable position. 2. Interim clearing of changes of congestive heart failure. Low lung volumes with mild bibasilar atelectasis.  Electronically  Signed   ByMaisie Fus  Register   On: 02/07/2016 07:20     STUDIES:  01/26/2016 outside hospital TTE: Left atrial enlargement, contrast suggestive of patent foramen ovale 01/26/2016 bilateral carotid ultrasound: No significant stenosis 01/26/2016 ultrasound Doppler legs>  negative for  DVT 01/27/2016 MR angiogram pelvis: Mild compression of the left common iliac vein without significant narrowing or thrombosis 01/27/2016 outside hospital TTE> Normal valves, left atrial enlargement, there is no abnormal contrast to suggest intra-atrial shunting 6/5 CT abdomen OSH> CT angiogram findings worrisome for superior mesenteric artery clot, small bowel ischemia, infarcts to kidney and spleen. 6/12  EEG >> generalized background slowing, consistent with diffuse cerebral dysfunction, nonspecific finding with toxic-metabolic etiologies, sedation 1/19  ECHO with Bubble Study >>  6/13  UE Venous Duplex >>  6/13  LE Venous Duplex >> 6/13  LE Arterial Duplex >>   CULTURES: None  ANTIBIOTICS: Ertapenem 6/6 >>  Vanc 6/12 >>  SIGNIFICANT EVENTS: 6/05 CTA abd >> worrisome for superior mesenteric artery clot 6/05 OR >> small bowel resection, aortomesenteric bypass  6/09 back to OR >> abd closure   LINES/TUBES: ETT 6/5 >>  TLC R IJ >>   DISCUSSION:  60 year old male who has small bowel ischemia, possible renal infarct in the setting of the superior mesenteric artery clot. This occurs several days after recent hospitalization for stroke thought to be embolic.  Uncertain etiology of hypercoagulable state.  Remains critically ill, s/p surgical repair.     ASSESSMENT / PLAN:  PULMONARY A: Mechanical ventilatory support postoperative Respiratory Alkalosis - suspect in setting of neuro findings, fever History of tobacco use ? Left lower lobe PNA vs Pulmonary Edema  P:   Diuresis as below  PS as able no extubation given neuro status. Titrate O2 for sat of 88-92%. 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 Repeat ECHO with bubble study  RENAL A:   Concern for partial right renal infarct on CT angiogram, normal renal function P:   Monitor BMET and UOP Replace electrolytes as needed Lasix 8 mg/hr IV drip.  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 Nothing by mouth. PPI. Continue TPN for now.  HEMATOLOGIC A:   Concern for hypercoagulable state Stents of the workup for embolic cause of multiple arterial strokes negative at outside hospital 3-4 days prior to admission Thrombocytopenia  P:  Hypercoagulability panel > negative. Hold heparin until okay by neurology, presumably June 13 (14 days post stroke).  Await recommendations from Neuro.  Hematology feels clots are due to a cardiac origin.  INFECTIOUS A:   Peritonitis Fever - new on 6/12, vanco added  P:   Monitor fever curve / WBC ABX / cultures as above  Monitor abd wound  ENDOCRINE A:   No acute  issues P:   Monitor glucose  NEUROLOGIC A:   Recent stroke P:   RASS goal: 0 Lighten sedation as able. Fentanyl PRN  Versed PRN   FAMILY  - Updates: no family available 6/13.  - Inter-disciplinary family meet or Palliative Care meeting due  by:  day 7  Canary Brim, NP-C [facility] Pgr: [phone] or if no answer [phone] 02/07/2016, 11:29 AM  Attending Note:  60 year old male with clots from unknown source, now a major CVA and respiratory failure.   On exam, patient is unresponsive but withdraws to pain on the right lower.  I reviewed CXR myself, ETT ok.  Patient's prognosis is very poor.  Reviewed notes from neuro.  Will order some additional vascular work up.  Will need to have a family meeting once neuro is ready for prognostication as family does not wish for prolonged aggressive care.  The patient is critically ill with multiple organ systems failure and requires high complexity decision making for assessment and support, frequent evaluation and titration of therapies, application of advanced monitoring technologies and extensive interpretation of multiple databases.   Critical Care Time devoted to patient care services described in this note is  35  Minutes. This time reflects time of care of this signee Dr Koren Bound. This critical care time does not reflect procedure time, or teaching time or supervisory time of PA/NP/Med student/Med Resident etc but could involve care discussion time.  Alyson Reedy, M.D. [facility] Pulmonary/Critical Care Medicine. Pager: [phone]. After hours pager: [phone].

## 2016-02-07 NOTE — Progress Notes (Signed)
[facility] FOR [facility] LAB REPLACEMENT ONLY  The [facility] does [facility] based on the criteria listed below:   1. Is GFR >/= 40 ml/min? Yes.    Patient's GFR today is >60 2. Is urine output >/= 0.5 ml/kg/hr for the last 6 hours? Yes.   Patient's UOP is 7.1 ml/kg/hr 3. Is BUN < 60 mg/dL? Yes.    Patient's BUN today is 34 4. Abnormal electrolyte  K 2.7 5. Ordered repletion with: per protocol 6. If a panic level lab has been reported, has the CCM MD in charge been notified? Yes.  .   Physician:  Brian Baxter  Brian Baxter, Brian Baxter 02/07/2016 4:35 AM

## 2016-02-07 NOTE — Progress Notes (Signed)
Subjective: Interval History: none..   Objective: Vital signs in last 24 hours: Temp:  [99.2 F (37.3 C)-102.5 F (39.2 C)] 101.4 F (38.6 C) (06/13 0403) Pulse Rate:  [68-113] 94 (06/13 0600) Resp:  [16-32] 20 (06/13 0600) BP: (96-159)/(59-136) 104/66 mmHg (06/13 0600) SpO2:  [99 %-100 %] 99 % (06/13 0600) Arterial Line BP: (105-178)/(53-87) 105/60 mmHg (06/13 0600) FiO2 (%):  [40 %] 40 % (06/13 0400)  Intake/Output from previous day: 06/12 0701 - 06/13 0700 In: 2169.2 [I.V.:219.2; NG/GT:240; IV Piggyback:550; TPN:1160] Out: 7235 [Urine:5585; Emesis/NG output:1600; Drains:50] Intake/Output this shift: Total I/O In: 1190.9 [I.V.:90.9; NG/GT:90; IV Piggyback:350; TPN:660] Out: 6050 [Urine:4800; Emesis/NG output:1200; Drains:50]  Unresponsive on vent. Abdomen soft. VAC dressing intact. Lower extremities well-perfused.  Lab Results:  Recent Labs  02/06/16 0418 02/07/16 0358  WBC 19.6* 22.3*  HGB 9.0* 10.5*  HCT 26.2* 30.7*  PLT 42* 62*   BMET  Recent Labs  02/06/16 0418 02/07/16 0358  NA 138 138  K 3.8 2.7*  CL 110 101  CO2 20* 25  GLUCOSE 132* 154*  BUN 32* 34*  CREATININE 1.04 1.12  CALCIUM 7.1* 7.8*    Studies/Results: Ct Angio Head W/cm &/or Wo Cm  02/06/2016  CLINICAL DATA:  Stroke. EXAM: CT ANGIOGRAPHY HEAD AND NECK TECHNIQUE: Multidetector CT imaging of the head and neck was performed using the standard protocol during bolus administration of intravenous contrast. Multiplanar CT image reconstructions and MIPs were obtained to evaluate the vascular anatomy. Carotid stenosis measurements (when applicable) are obtained utilizing NASCET criteria, using the distal internal carotid diameter as the denominator. CONTRAST:  50 mL Isovue 370 IV COMPARISON:  MRI head 02/05/2016 FINDINGS: CT HEAD Brain: Large territory subacute infarct in the right MCA territory similar to the prior MRI. This extends into the right occipital lobe in the right PCA territory. Subacute  infarct in the left MCA territory over the convexity. This may be a watershed infarct. Subacute infarct left occipital lobe. Multiple areas of subacute infarction in the cerebellum bilaterally. Subacute infarct left caudate. These are all similar to the recent MRI. No associated acute hemorrhage. No shift of the midline structures Calvarium and skull base: Negative Paranasal sinuses: Clear. The patient is intubated. NG tube in the esophagus. Orbits: No orbital lesion. CTA NECK Aortic arch: Aortic arch not imaged. Proximal great vessels are patent with diffuse atherosclerotic calcification. Ground-glass airspace disease bilaterally likely due to mild edema. Right carotid system: Right common carotid artery widely patent. Atherosclerotic calcification of the right carotid bifurcation. 50% diameter stenosis proximal right internal carotid artery. Right external carotid artery widely patent. Left carotid system: Left common carotid artery widely patent. Atherosclerotic calcification left carotid bifurcation. 75% stenosis proximal left internal carotid artery. Mild stenosis external carotid artery on the left. Vertebral arteries:Atherosclerotic calcification is seen scattered throughout both vertebral arteries. Moderately severe calcific stenosis origin right vertebral artery. Mild stenosis origin left vertebral artery. Moderate stenosis distal left vertebral artery due to calcific stenosis. Skeleton: Negative Other neck: Negative for mass or adenopathy in the neck. CTA HEAD Anterior circulation: Extensive atherosclerotic calcification throughout the cavernous carotid artery bilaterally with moderate to severe stenosis bilaterally in the cavernous carotid. Anterior and middle cerebral arteries patent bilaterally. Decreased flow in right middle cerebral artery branches due to subacute infarction. No filling defect. No large vessel occlusion. Posterior circulation: Moderate stenosis distal left vertebral artery. PICA  patent bilaterally. Basilar patent. Superior cerebellar and posterior cerebral arteries patent bilaterally without stenosis. Venous sinuses: Patent Anatomic variants: Negative for  cerebral aneurysm. Delayed phase: Normal enhancement postcontrast administration. IMPRESSION: Multiple areas of subacute infarct in both cerebral hemispheres and both cerebellar hemispheres. These are similar in distribution to the recent MRI yesterday. No acute hemorrhage 50% diameter stenosis proximal right internal carotid artery due to calcific stenosis. Moderate to severe stenosis in the right cavernous carotid due to calcific stenosis 75% diameter stenosis proximal left internal carotid artery due to calcific stenosis. Moderate to severe stenosis left cavernous carotid due to calcific stenosis Moderate stenosis at the origin of the right vertebral artery. Moderate stenosis distal left vertebral artery. Scattered atherosclerotic disease throughout both vertebral arteries. No large vessel intracranial occlusion. Decrease flow in the right middle cerebral artery branches due to large territory subacute infarction. Electronically Signed   By: Marlan Palau M.D.   On: 02/06/2016 17:37   Ct Angio Neck W/cm &/or Wo/cm  02/06/2016  CLINICAL DATA:  Stroke. EXAM: CT ANGIOGRAPHY HEAD AND NECK TECHNIQUE: Multidetector CT imaging of the head and neck was performed using the standard protocol during bolus administration of intravenous contrast. Multiplanar CT image reconstructions and MIPs were obtained to evaluate the vascular anatomy. Carotid stenosis measurements (when applicable) are obtained utilizing NASCET criteria, using the distal internal carotid diameter as the denominator. CONTRAST:  50 mL Isovue 370 IV COMPARISON:  MRI head 02/05/2016 FINDINGS: CT HEAD Brain: Large territory subacute infarct in the right MCA territory similar to the prior MRI. This extends into the right occipital lobe in the right PCA territory. Subacute infarct  in the left MCA territory over the convexity. This may be a watershed infarct. Subacute infarct left occipital lobe. Multiple areas of subacute infarction in the cerebellum bilaterally. Subacute infarct left caudate. These are all similar to the recent MRI. No associated acute hemorrhage. No shift of the midline structures Calvarium and skull base: Negative Paranasal sinuses: Clear. The patient is intubated. NG tube in the esophagus. Orbits: No orbital lesion. CTA NECK Aortic arch: Aortic arch not imaged. Proximal great vessels are patent with diffuse atherosclerotic calcification. Ground-glass airspace disease bilaterally likely due to mild edema. Right carotid system: Right common carotid artery widely patent. Atherosclerotic calcification of the right carotid bifurcation. 50% diameter stenosis proximal right internal carotid artery. Right external carotid artery widely patent. Left carotid system: Left common carotid artery widely patent. Atherosclerotic calcification left carotid bifurcation. 75% stenosis proximal left internal carotid artery. Mild stenosis external carotid artery on the left. Vertebral arteries:Atherosclerotic calcification is seen scattered throughout both vertebral arteries. Moderately severe calcific stenosis origin right vertebral artery. Mild stenosis origin left vertebral artery. Moderate stenosis distal left vertebral artery due to calcific stenosis. Skeleton: Negative Other neck: Negative for mass or adenopathy in the neck. CTA HEAD Anterior circulation: Extensive atherosclerotic calcification throughout the cavernous carotid artery bilaterally with moderate to severe stenosis bilaterally in the cavernous carotid. Anterior and middle cerebral arteries patent bilaterally. Decreased flow in right middle cerebral artery branches due to subacute infarction. No filling defect. No large vessel occlusion. Posterior circulation: Moderate stenosis distal left vertebral artery. PICA patent  bilaterally. Basilar patent. Superior cerebellar and posterior cerebral arteries patent bilaterally without stenosis. Venous sinuses: Patent Anatomic variants: Negative for cerebral aneurysm. Delayed phase: Normal enhancement postcontrast administration. IMPRESSION: Multiple areas of subacute infarct in both cerebral hemispheres and both cerebellar hemispheres. These are similar in distribution to the recent MRI yesterday. No acute hemorrhage 50% diameter stenosis proximal right internal carotid artery due to calcific stenosis. Moderate to severe stenosis in the right cavernous carotid due to calcific  stenosis 75% diameter stenosis proximal left internal carotid artery due to calcific stenosis. Moderate to severe stenosis left cavernous carotid due to calcific stenosis Moderate stenosis at the origin of the right vertebral artery. Moderate stenosis distal left vertebral artery. Scattered atherosclerotic disease throughout both vertebral arteries. No large vessel intracranial occlusion. Decrease flow in the right middle cerebral artery branches due to large territory subacute infarction. Electronically Signed   By: Marlan Palau M.D.   On: 02/06/2016 17:37   Mr Brain Wo Contrast  02/05/2016  CLINICAL DATA:  Multifocal embolic infarcts, splenic and bowel infarcts. Assess for further embolic ischemia. Worsening leg weakness. EXAM: MRI HEAD WITHOUT CONTRAST TECHNIQUE: Multiplanar, multiecho pulse sequences of the brain and surrounding structures were obtained without intravenous contrast. COMPARISON:  CT HEAD Jan 25, 2016 FINDINGS: INTRACRANIAL CONTENTS: Patchy reduced diffusion in bilateral cerebellum, vermis. Confluent reduced diffusion within frontal, parietal, occipital lobes. Extension of reduced diffusion to RIGHT posterior temporal lobe. Patchy reduced diffusion splenium of the corpus callosum. Patchy reduced diffuse in LEFT posterior temporal lobe. Patchy reduced diffusion LEFT basal ganglia. All areas of  reduced diffusion demonstrate low ADC values. Patchy areas of susceptibility artifact within areas of infarct associated with T1 shortening. Local sulcal effacement. 3 mm RIGHT to LEFT midline shift with mass effect on RIGHT lateral ventricle atrium. No ventricular entrapment or hydrocephalus. No abnormal extra-axial fluid collections. Major intracranial vascular flow voids present at skull base. ORBITS: Trace RIGHT maxillary sinus mucosal thickening. Small bilateral mastoid effusions. SINUSES: The mastoid air-cells and included paranasal sinuses are well-aerated. SKULL/SOFT TISSUES: No abnormal sellar expansion. No suspicious calvarial bone marrow signal. Craniocervical junction maintained. IMPRESSION: Extensive supra and infratentorial acute infarcts, including RIGHT greater than LEFT MCA and PCA territories consistent with embolic phenomena. Petechial hemorrhage without frank hemorrhagic conversion. 3 mm RIGHT to LEFT midline shift without ventricular entrapment. These results will be called to the ordering clinician or representative by the Radiologist Assistant, and communication documented in the PACS or zVision Dashboard. Electronically Signed   By: Awilda Metro M.D.   On: 02/05/2016 23:24   Mr Cervical Spine Wo Contrast  02/06/2016  CLINICAL DATA:  Extensive cerebral and abdominal infarcts including ischemic bowel. Leg weakness. EXAM: MRI CERVICAL, THORACIC SPINE WITHOUT CONTRAST TECHNIQUE: Multiplanar and multiecho pulse sequences of the cervical spine, to include the craniocervical junction and cervicothoracic junction, and thoracic spine, were obtained without intravenous contrast. COMPARISON:  None. FINDINGS: MRI CERVICAL SPINE FINDINGS ALIGNMENT: Straightened cervical lordosis.  No malalignment. VERTEBRAE/DISCS: Vertebral bodies are intact. Intervertebral disc morphology's and signal are normal. CORD:Cervical spinal cord is normal morphology and signal characteristics from the cervicomedullary  junction to level of T1-2, the most caudal well visualized level. POSTERIOR FOSSA, VERTEBRAL ARTERIES, PARASPINAL TISSUES: No MR findings of ligamentous injury. Vertebral artery flow voids present. Included view of the posterior fossa demonstrates multiple cerebellar infarcts. Subcutaneous fat interstitial edema. DISC LEVELS: Mildly motion degraded examination. C2-3: No disc bulge, canal stenosis nor neural foraminal narrowing. C3-4 and C4-5: Tiny central disc protrusion without canal stenosis or neural foraminal narrowing. C5-6: Minimal annular bulging without canal stenosis neural foraminal narrowing. C6-7: Minimal annular bulging and tiny central disc protrusion without canal stenosis or neural foraminal narrowing. C7-T1: No disc bulge, canal stenosis nor neural foraminal narrowing. MRI THORACIC SPINE FINDINGS ALIGNMENT: Maintenance of the thoracic kyphosis. No malalignment. VERTEBRAE/DISCS: Vertebral bodies are intact. Intervertebral disc morphology's and signal are normal. Scattered chronic Schmorl's nodes. Mild subacute discogenic endplate changes at T6-7, T10-11, T11-12. CORD: Thoracic spinal cord  is normal morphology and signal characteristics to the level the conus medullaris which terminates at T12-L1. PREVERTEBRAL AND PARASPINAL SOFT TISSUES: Mild subcutaneous fat interstitial dependent edema. Prominent upper and mid thoracic dorsal epidural fat. Life-support lines in place, not tailored for evaluation. DEGENERATIVE CHANGE: No canal stenosis or neural foraminal narrowing at any level. At T6-7 is small RIGHT central disc protrusion. At T7-8 is small LEFT central disc protrusion and annular fissure. IMPRESSION: MRI CERVICAL SPINE: No MR findings of spinal cord infarct though diffusion weighted sequences not obtained. Negative noncontrast MRI of the cervical spine. MRI THORACIC SPINE: No MR findings of spinal cord infarct though diffusion-weighted sequences not obtained. Small mid thoracic disc protrusions  without canal stenosis, neural foraminal narrowing or acute osseous process. Electronically Signed   By: Awilda Metro M.D.   On: 02/06/2016 00:53   Mr Thoracic Spine Wo Contrast  02/06/2016  CLINICAL DATA:  Extensive cerebral and abdominal infarcts including ischemic bowel. Leg weakness. EXAM: MRI CERVICAL, THORACIC SPINE WITHOUT CONTRAST TECHNIQUE: Multiplanar and multiecho pulse sequences of the cervical spine, to include the craniocervical junction and cervicothoracic junction, and thoracic spine, were obtained without intravenous contrast. COMPARISON:  None. FINDINGS: MRI CERVICAL SPINE FINDINGS ALIGNMENT: Straightened cervical lordosis.  No malalignment. VERTEBRAE/DISCS: Vertebral bodies are intact. Intervertebral disc morphology's and signal are normal. CORD:Cervical spinal cord is normal morphology and signal characteristics from the cervicomedullary junction to level of T1-2, the most caudal well visualized level. POSTERIOR FOSSA, VERTEBRAL ARTERIES, PARASPINAL TISSUES: No MR findings of ligamentous injury. Vertebral artery flow voids present. Included view of the posterior fossa demonstrates multiple cerebellar infarcts. Subcutaneous fat interstitial edema. DISC LEVELS: Mildly motion degraded examination. C2-3: No disc bulge, canal stenosis nor neural foraminal narrowing. C3-4 and C4-5: Tiny central disc protrusion without canal stenosis or neural foraminal narrowing. C5-6: Minimal annular bulging without canal stenosis neural foraminal narrowing. C6-7: Minimal annular bulging and tiny central disc protrusion without canal stenosis or neural foraminal narrowing. C7-T1: No disc bulge, canal stenosis nor neural foraminal narrowing. MRI THORACIC SPINE FINDINGS ALIGNMENT: Maintenance of the thoracic kyphosis. No malalignment. VERTEBRAE/DISCS: Vertebral bodies are intact. Intervertebral disc morphology's and signal are normal. Scattered chronic Schmorl's nodes. Mild subacute discogenic endplate changes  at T6-7, T10-11, T11-12. CORD: Thoracic spinal cord is normal morphology and signal characteristics to the level the conus medullaris which terminates at T12-L1. PREVERTEBRAL AND PARASPINAL SOFT TISSUES: Mild subcutaneous fat interstitial dependent edema. Prominent upper and mid thoracic dorsal epidural fat. Life-support lines in place, not tailored for evaluation. DEGENERATIVE CHANGE: No canal stenosis or neural foraminal narrowing at any level. At T6-7 is small RIGHT central disc protrusion. At T7-8 is small LEFT central disc protrusion and annular fissure. IMPRESSION: MRI CERVICAL SPINE: No MR findings of spinal cord infarct though diffusion weighted sequences not obtained. Negative noncontrast MRI of the cervical spine. MRI THORACIC SPINE: No MR findings of spinal cord infarct though diffusion-weighted sequences not obtained. Small mid thoracic disc protrusions without canal stenosis, neural foraminal narrowing or acute osseous process. Electronically Signed   By: Awilda Metro M.D.   On: 02/06/2016 00:53   Dg Chest Portable 1 View  02/06/2016  CLINICAL DATA:  Respiratory failure. EXAM: PORTABLE CHEST 1 VIEW COMPARISON:  02/05/2016. FINDINGS: Endotracheal tube, NG tube, right IJ line stable position. Cardiomegaly with persistent bilateral pulmonary interstitial prominence consistent with congestive heart failure. Small left pleural effusion. No pneumothorax . IMPRESSION: 1. Lines and tubes in stable position. 2.Persistent changes of congestive heart failure with bilateral pulmonary interstitial  edema and small left pleural effusion. No significant change from prior exam. Electronically Signed   By: Maisie Fus  Register   On: 02/06/2016 07:41   Dg Chest Portable 1 View  02/05/2016  CLINICAL DATA:  Respiratory failure EXAM: PORTABLE CHEST 1 VIEW COMPARISON:  02/04/2016 FINDINGS: Cardiomegaly again noted. Stable endotracheal and NG tube position. Right IJ central line is unchanged in position. Mild  interstitial prominence bilaterally suspicious for mild interstitial edema. Persistent bilateral lower lobe atelectasis or infiltrate left greater than right. Atherosclerotic calcifications of thoracic aorta. IMPRESSION: Stable endotracheal and NG tube position. Right IJ central line is unchanged in position. Mild interstitial prominence bilaterally suspicious for mild interstitial edema. Persistent bilateral lower lobe atelectasis or infiltrate left greater than right. Electronically Signed   By: Natasha Mead M.D.   On: 02/05/2016 09:53   Dg Chest Port 1 View  02/04/2016  CLINICAL DATA:  Acute respiratory failure EXAM: PORTABLE CHEST 1 VIEW COMPARISON:  February 03, 2016 FINDINGS: Stable support apparatus. Cardiomediastinal silhouette is stable. Persistent infiltrate in medial right base and left perihilar region. No other changes. IMPRESSION: Persistent pulmonary infiltrates and stable support apparatus. Electronically Signed   By: Gerome Sam III M.D   On: 02/04/2016 07:54   Dg Chest Port 1 View  02/19/2016  CLINICAL DATA:  Ventilator dependent respiratory failure. Followup left lower lobe atelectasis and/or pneumonia. EXAM: PORTABLE CHEST 1 VIEW COMPARISON:  02/02/2016 and earlier. FINDINGS: Endotracheal tube tip in satisfactory position below the thoracic inlet. Right subclavian central venous catheter tip projects over the mid SVC, unchanged. Nasogastric tube courses below the diaphragm into the stomach. Persistent dense consolidation in the left lower lobe. Interval worsening of airspace opacities in the right lung base. Cardiac silhouette mildly to moderately enlarged, unchanged. Pulmonary venous hypertension without overt edema currently. IMPRESSION: 1.  Support apparatus satisfactory. 2. Stable dense left lower lobe atelectasis and/or pneumonia. 3. Worsening right basilar atelectasis and/or pneumonia. Electronically Signed   By: Hulan Saas M.D.   On: 02/16/2016 07:53   Dg Chest Port 1  View  02/02/2016  CLINICAL DATA:  Respiratory failure EXAM: PORTABLE CHEST 1 VIEW COMPARISON:  01/31/2016 FINDINGS: There is an ET tube with tip above the carina. There is a right IJ catheter tip in the projection of the SVC. Moderate cardiac enlargement identified. Aortic atherosclerosis noted. There is a moderate left pleural effusion. Diminished aeration to the right midlung and left base is similar to previous exam. Fluid along the minor fissure is identified within the right midlung. IMPRESSION: 1. Persistent decreased aeration to the left midlung and left base. 2. Bilateral pleural effusions left greater than right. Electronically Signed   By: Signa Kell M.D.   On: 02/02/2016 08:23   Dg Chest Port 1 View  02/20/2016  CLINICAL DATA:  Abdominal surgery, endotracheal tube placement EXAM: PORTABLE CHEST 1 VIEW COMPARISON:  Portable chest x-ray of 01/31/2016 and 01/27/2016, CT chest of 01/24/2006 FINDINGS: There has been and increase in bibasilar opacities left-greater-than-right most consistent with atelectasis and possible effusion on the left. Pneumonia particularly the left lung base cannot be excluded. The tip of the endotracheal tube is approximately 4.3 cm above the carina. Right IJ central venous line tip overlies the mid SVC and the NG tube extends below the hemidiaphragm. IMPRESSION: 1. Increasing basilar opacities left-greater-than-right most consistent with atelectasis and possible left effusion. Cannot exclude pneumonia at the left lung base. 2. Tip of endotracheal tube 4.3 cm above the carina. Electronically Signed   By: Renae Fickle  Gery PrayBarry M.D.   On: 2016-07-03 08:21   Portable Chest Xray  01/31/2016  CLINICAL DATA:  60 year old male with acute respiratory failure and hypoxia. Status post exploratory laparotomy. EXAM: PORTABLE CHEST 1 VIEW COMPARISON:  None FINDINGS: Endotracheal tube with tip approximately 2.3 cm above the carina. Recommend retraction by approximately 3- 4 cm for optimal  positioning. An enteric tube is partially visualized coursing to the left in the abdomen. There is a right IJ central venous line with tip over central SVC. There is atelectatic changes at the left lung base. No focal consolidation. No significant pleural effusion. No pneumothorax. Top-normal cardiac size. No acute osseous pathology. IMPRESSION: Endotracheal tube above the carina. Left lung base atelectatic changes. No focal consolidation. No pneumothorax. Electronically Signed   By: Elgie CollardArash  Radparvar M.D.   On: 01/31/2016 03:29   Anti-infectives: Anti-infectives    Start     Dose/Rate Route Frequency Ordered Stop   02/06/16 1600  vancomycin (VANCOCIN) IVPB 1000 mg/200 mL premix     1,000 mg 200 mL/hr over 60 Minutes Intravenous Every 12 hours 02/06/16 1539     01/31/16 0400  ertapenem (INVANZ) 1 g in sodium chloride 0.9 % 50 mL IVPB     1 g 100 mL/hr over 30 Minutes Intravenous Every 24 hours 01/31/16 0334     01/31/16 0100  [MAR Hold]  cefOXitin (MEFOXIN) 2 g in dextrose 5 % 50 mL IVPB     (MAR Hold since 01/31/16 0049)   2 g 100 mL/hr over 30 Minutes Intravenous To Surgery 01/31/16 0046 01/31/16 0118      Assessment/Plan: s/p Procedure(s): EXPLORATORY LAPAROTOMY (N/A) SMALL BOWEL RESECTION (N/A) Appreciate critical care medicine's management. Guarded neurologic outcome. Await recommendations from neurology regarding expected function with major CVA. We'll continue discussion with family   LOS: 8 days   Devora Tortorella 02/07/2016, 6:43 AM

## 2016-02-07 NOTE — Significant Event (Signed)
Late entry:    Approximately 45cc of fentanyl drip wasted in sink and flushed with RN Lezlie LyeLisa Frei as witness.

## 2016-02-07 NOTE — Progress Notes (Signed)
PARENTERAL NUTRITION CONSULT NOTE  Pharmacy Consult for TPN Indication: Massive Bowel Resection   Allergies  Allergen Reactions  . Atorvastatin Other (See Comments)    Myalgias (intolerance)  . Penicillins Hives and Rash  . Simvastatin Rash    Patient Measurements: Height: '5\' 9"'$  (175.3 cm) Weight: 176 lb 5.9 oz (80 kg) IBW/kg (Calculated) : 70.7   Vital Signs: Temp: 101.4 F (38.6 C) (06/13 0403) Temp Source: Axillary (06/13 0403) BP: 104/66 mmHg (06/13 0743) Pulse Rate: 88 (06/13 0743) Intake/Output from previous day: 06/12 0701 - 06/13 0700 In: 2242.2 [I.V.:232.2; NG/GT:240; IV Piggyback:550; TPN:1220] Out: 4315 [Urine:5585; Emesis/NG output:1600; Drains:50] Intake/Output from this shift: Total I/O In: 50 [IV Piggyback:50] Out: -   Labs:  Recent Labs  02/05/16 0400 02/06/16 0418 02/07/16 0358  WBC 18.9* 19.6* 22.3*  HGB 9.7* 9.0* 10.5*  HCT 28.6* 26.2* 30.7*  PLT 46* 42* 62*     Recent Labs  02/05/16 0400 02/06/16 0418 02/06/16 0500 02/07/16 0358  NA 137 138  --  138  K 4.3 3.8  --  2.7*  CL 114* 110  --  101  CO2 19* 20*  --  25  GLUCOSE 146* 132*  --  154*  BUN 19 32*  --  34*  CREATININE 0.80 1.04  --  1.12  CALCIUM 7.1* 7.1*  --  7.8*  MG  --  2.3  --  2.3  PHOS  --  5.8*  --  5.0*  PROT  --  4.3*  --  6.0*  ALBUMIN  --  1.1*  --  1.6*  AST  --  135*  --  225*  ALT  --  76*  --  134*  ALKPHOS  --  79  --  114  BILITOT  --  3.5*  --  3.9*  PREALBUMIN  --  5.0*  --   --   TRIG  --   --  305*  --    Estimated Creatinine Clearance: 71 mL/min (by C-G formula based on Cr of 1.12).    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 Pharmacy consulted to start TPN.  Surgeries/Procedures: 6/9 Exp Lap & SBR   GI: POD #4. Has open abdomen and  wound vac in place. prealbumin low at 5. 2 BMs in last 2 days.   Endo: CBGs increasing with increased TPN rate- 114-156  Lytes: K 2.7- 8 runs ordered per [facility] protocol , Mg 2.3, Phos still elevated at 5 (slight drop), CorCa ~9.7 (Ca x phos = 48)  Renal: SCr 0.8>1.04>1.12, UOP **- on Lasix gtt, got 1 dose of metolazone yesterday as well  Pulm: Intubated - 40% FiO2  Cards: HTN, VSS. Holding anticoagulation- ?starting 14 days after stroke (would be 6/13). Remains on ASA PR. Having bradycardia and hypotension d/t sedation  Hepatobil: AST increased further to 225, ALT also higher at 134, TBili 3.9 (high, stable), alk phos normal, albumin only 1.6. 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 Hematology following for workup of embolic events without clear source. Stroke team consulted- to repeat TTE, CTZ, dopplers, and vasculitic labs. EEG with generalized background slowly, consistent with diffuse cerebral dysfunction per neurologist  report.  On fentantyl gtt, RASS 1, GCS 6, CPOT 0   ID: WBC increased to 22.3, tmax/24h 102.5. On D#8 of ertapenem for peritonitis, D#2 of vancomycin for ?HCAP as well  Best Practices: Pepcid, MC   TPN Access: CVC triple lumen 6/5>>  TPN start date: 6/11>>   Nutritional Goals: (per RD recommendations 6/12) 2365 Kcal, 120-130g protein  Current Nutrition:  Clinimix E 5/15 at 60 mL/hr- provides 72g protein and 1022kcal. Holding lipids for 7 days as patient is critically ill in ICU  Plan:  -Concerned drops seen in potassium and phosphorus are signs of refeeding- will not increase TPN rate today. Continue Clinimix E 5/15 at 66m/hr. Hold lipids for 7 days since patient is critically ill in the ICU. Day 3/7 of holding lipids. -MVI and full trace elements in TPN- watch, if he becomes jaundiced, will need to cut trace elements to half -Pepcid  '40mg'$  added to TPN -Continue SSI -Discontinue MIVF as never hung and patient is on Lasix gtt per CCM -CMET in the morning to watch LFT trend as well as lytes   Mahaila Tischer D. Tiamarie Furnari, PharmD, BCPS Clinical Pharmacist Pager: [phone]/13/2017 7:56 AM

## 2016-02-07 NOTE — Significant Event (Signed)
Dr. Pearlean BrownieSethi made aware of troponin 3.34. No new orders at this time.

## 2016-02-07 NOTE — Significant Event (Signed)
[facility] made aware of potassium 2.8 and ABG results. New orders for potassium runs and to decrease tidal volume to 530.

## 2016-02-07 NOTE — Progress Notes (Signed)
CRITICAL VALUE ALERT  Critical value received:  K 2.7  Date of notification:  02/07/16  Time of notification:  0425  Critical value read back:Yes.    Nurse who received alert:  Ron AgeeElizabeth Letzy Gullickson, RN  MD notified (1st page):  [facility] Marisue IvanLiz replace per protocol   Time of first page:  0425  MD notified (2nd page):  Time of second page:  Responding MD:  Pola CornElink RN Marisue IvanLiz, replace per ICU protocol  Time MD responded:  [phone]

## 2016-02-07 NOTE — Progress Notes (Signed)
VASCULAR LAB PRELIMINARY  PRELIMINARY  PRELIMINARY  PRELIMINARY  Bilateral lower extremity venous duplex completed.    Preliminary report:  Bilateral:  No evidence of DVT, superficial thrombosis, or Baker's Cyst.   Geoffrey Mankin, RVS 02/07/2016, 6:31 PM

## 2016-02-07 NOTE — Progress Notes (Signed)
STROKE TEAM PROGRESS NOTE   HISTORY OF PRESENT ILLNESS (per record) Brian Baxter is a 60 y.o. male with a history of recent stroke on May 31. He was admitted with multiple embolic strokes which was worked up at [facility]. He underwent echo, TEE showing no source of emboli. These were in both the anterior and posterior circulation and in both cerebral hemispheres. They did an MRI pelvis looking for DVT that was not visible otherwise and this was negative. MRA head with only mild atherosclerotic changes of the ICAs. Carotid Dopplers with no stenosis. LDL 134  The largest was noted to be 2.4 x 4.2 cm the right occipital lobe. He then went home and presented to [facility] with abdominal pain. 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 He was transferred to [facility] for IV TPA administration, but given his recent CVA that was not possible. He was seen by Dr. early of vascular surgery who performed bowel resection with a wound VAC left in place.  Given his open abdomen, he was heavily sedated following the procedure. On lightning sedation, however he has been noted to not be waking up. Given his recent embolic infarcts, neurology has been consulted for further evaluation.  His LKW for this presentation is unclear. Patient was not administered IV t-PA secondary to recent stroke. He was admitted post op for further evaluation and treatment.   SUBJECTIVE (INTERVAL HISTORY) Patient is intubated. He remains unresponsive. Patient's wife and son at the bedside had a long conversation with them about his prognosis. CT angiogram results reviewed and they seem to be discordant to the carotid ultrasound and MR angiogram done at [facility]. It would be unusual for significant stenosis or development in such a short period of time. Repeat transthoracic echo today is pending  OBJECTIVE Temp:  [99.2 F (37.3 C)-101.4 F (38.6 C)] 100.8 F (38.2  C) (06/13 1250) Pulse Rate:  [30-113] 95 (06/13 1400) Cardiac Rhythm:  [-] Normal sinus rhythm (06/13 0730) Resp:  [14-34] 17 (06/13 1400) BP: (96-147)/(66-84) 115/77 mmHg (06/13 1200) SpO2:  [99 %-100 %] 100 % (06/13 1400) Arterial Line BP: (105-178)/(58-87) 115/61 mmHg (06/13 1400) FiO2 (%):  [40 %] 40 % (06/13 1500)  CBC:   Recent Labs Lab 02/06/16 0418 02/07/16 0358  WBC 19.6* 22.3*  NEUTROABS 16.2*  --   HGB 9.0* 10.5*  HCT 26.2* 30.7*  MCV 87.0 86.5  PLT 42* 62*    Basic Metabolic Panel:   Recent Labs Lab 02/06/16 0418 02/07/16 0358  NA 138 138  K 3.8 2.7*  CL 110 101  CO2 20* 25  GLUCOSE 132* 154*  BUN 32* 34*  CREATININE 1.04 1.12  CALCIUM 7.1* 7.8*  MG 2.3 2.3  PHOS 5.8* 5.0*    Lipid Panel:     Component Value Date/Time   TRIG 305* 02/06/2016 0500   HgbA1c: No results found for: HGBA1C Urine Drug Screen: No results found for: LABOPIA, COCAINSCRNUR, LABBENZ, AMPHETMU, THCU, LABBARB    IMAGING  Ct Angio Head W/cm &/or Wo Cm  02/06/2016  CLINICAL DATA:  Stroke. EXAM: CT ANGIOGRAPHY HEAD AND NECK TECHNIQUE: Multidetector CT imaging of the head and neck was performed using the standard protocol during bolus administration of intravenous contrast. Multiplanar CT image reconstructions and MIPs were obtained to evaluate the vascular anatomy. Carotid stenosis measurements (when applicable) are obtained utilizing NASCET criteria, using the distal internal carotid diameter as the denominator. CONTRAST:  50 mL  Isovue 370 IV COMPARISON:  MRI head 02/05/2016 FINDINGS: CT HEAD Brain: Large territory subacute infarct in the right MCA territory similar to the prior MRI. This extends into the right occipital lobe in the right PCA territory. Subacute infarct in the left MCA territory over the convexity. This may be a watershed infarct. Subacute infarct left occipital lobe. Multiple areas of subacute infarction in the cerebellum bilaterally. Subacute infarct left caudate.  These are all similar to the recent MRI. No associated acute hemorrhage. No shift of the midline structures Calvarium and skull base: Negative Paranasal sinuses: Clear. The patient is intubated. NG tube in the esophagus. Orbits: No orbital lesion. CTA NECK Aortic arch: Aortic arch not imaged. Proximal great vessels are patent with diffuse atherosclerotic calcification. Ground-glass airspace disease bilaterally likely due to mild edema. Right carotid system: Right common carotid artery widely patent. Atherosclerotic calcification of the right carotid bifurcation. 50% diameter stenosis proximal right internal carotid artery. Right external carotid artery widely patent. Left carotid system: Left common carotid artery widely patent. Atherosclerotic calcification left carotid bifurcation. 75% stenosis proximal left internal carotid artery. Mild stenosis external carotid artery on the left. Vertebral arteries:Atherosclerotic calcification is seen scattered throughout both vertebral arteries. Moderately severe calcific stenosis origin right vertebral artery. Mild stenosis origin left vertebral artery. Moderate stenosis distal left vertebral artery due to calcific stenosis. Skeleton: Negative Other neck: Negative for mass or adenopathy in the neck. CTA HEAD Anterior circulation: Extensive atherosclerotic calcification throughout the cavernous carotid artery bilaterally with moderate to severe stenosis bilaterally in the cavernous carotid. Anterior and middle cerebral arteries patent bilaterally. Decreased flow in right middle cerebral artery branches due to subacute infarction. No filling defect. No large vessel occlusion. Posterior circulation: Moderate stenosis distal left vertebral artery. PICA patent bilaterally. Basilar patent. Superior cerebellar and posterior cerebral arteries patent bilaterally without stenosis. Venous sinuses: Patent Anatomic variants: Negative for cerebral aneurysm. Delayed phase: Normal  enhancement postcontrast administration. IMPRESSION: Multiple areas of subacute infarct in both cerebral hemispheres and both cerebellar hemispheres. These are similar in distribution to the recent MRI yesterday. No acute hemorrhage 50% diameter stenosis proximal right internal carotid artery due to calcific stenosis. Moderate to severe stenosis in the right cavernous carotid due to calcific stenosis 75% diameter stenosis proximal left internal carotid artery due to calcific stenosis. Moderate to severe stenosis left cavernous carotid due to calcific stenosis Moderate stenosis at the origin of the right vertebral artery. Moderate stenosis distal left vertebral artery. Scattered atherosclerotic disease throughout both vertebral arteries. No large vessel intracranial occlusion. Decrease flow in the right middle cerebral artery branches due to large territory subacute infarction. Electronically Signed   By: Franchot Gallo M.D.   On: 02/06/2016 17:37   Ct Angio Neck W/cm &/or Wo/cm  02/06/2016  CLINICAL DATA:  Stroke. EXAM: CT ANGIOGRAPHY HEAD AND NECK TECHNIQUE: Multidetector CT imaging of the head and neck was performed using the standard protocol during bolus administration of intravenous contrast. Multiplanar CT image reconstructions and MIPs were obtained to evaluate the vascular anatomy. Carotid stenosis measurements (when applicable) are obtained utilizing NASCET criteria, using the distal internal carotid diameter as the denominator. CONTRAST:  50 mL Isovue 370 IV COMPARISON:  MRI head 02/05/2016 FINDINGS: CT HEAD Brain: Large territory subacute infarct in the right MCA territory similar to the prior MRI. This extends into the right occipital lobe in the right PCA territory. Subacute infarct in the left MCA territory over the convexity. This may be a watershed infarct. Subacute infarct left occipital lobe.  Multiple areas of subacute infarction in the cerebellum bilaterally. Subacute infarct left caudate.  These are all similar to the recent MRI. No associated acute hemorrhage. No shift of the midline structures Calvarium and skull base: Negative Paranasal sinuses: Clear. The patient is intubated. NG tube in the esophagus. Orbits: No orbital lesion. CTA NECK Aortic arch: Aortic arch not imaged. Proximal great vessels are patent with diffuse atherosclerotic calcification. Ground-glass airspace disease bilaterally likely due to mild edema. Right carotid system: Right common carotid artery widely patent. Atherosclerotic calcification of the right carotid bifurcation. 50% diameter stenosis proximal right internal carotid artery. Right external carotid artery widely patent. Left carotid system: Left common carotid artery widely patent. Atherosclerotic calcification left carotid bifurcation. 75% stenosis proximal left internal carotid artery. Mild stenosis external carotid artery on the left. Vertebral arteries:Atherosclerotic calcification is seen scattered throughout both vertebral arteries. Moderately severe calcific stenosis origin right vertebral artery. Mild stenosis origin left vertebral artery. Moderate stenosis distal left vertebral artery due to calcific stenosis. Skeleton: Negative Other neck: Negative for mass or adenopathy in the neck. CTA HEAD Anterior circulation: Extensive atherosclerotic calcification throughout the cavernous carotid artery bilaterally with moderate to severe stenosis bilaterally in the cavernous carotid. Anterior and middle cerebral arteries patent bilaterally. Decreased flow in right middle cerebral artery branches due to subacute infarction. No filling defect. No large vessel occlusion. Posterior circulation: Moderate stenosis distal left vertebral artery. PICA patent bilaterally. Basilar patent. Superior cerebellar and posterior cerebral arteries patent bilaterally without stenosis. Venous sinuses: Patent Anatomic variants: Negative for cerebral aneurysm. Delayed phase: Normal  enhancement postcontrast administration. IMPRESSION: Multiple areas of subacute infarct in both cerebral hemispheres and both cerebellar hemispheres. These are similar in distribution to the recent MRI yesterday. No acute hemorrhage 50% diameter stenosis proximal right internal carotid artery due to calcific stenosis. Moderate to severe stenosis in the right cavernous carotid due to calcific stenosis 75% diameter stenosis proximal left internal carotid artery due to calcific stenosis. Moderate to severe stenosis left cavernous carotid due to calcific stenosis Moderate stenosis at the origin of the right vertebral artery. Moderate stenosis distal left vertebral artery. Scattered atherosclerotic disease throughout both vertebral arteries. No large vessel intracranial occlusion. Decrease flow in the right middle cerebral artery branches due to large territory subacute infarction. Electronically Signed   By: Franchot Gallo M.D.   On: 02/06/2016 17:37   Mr Brain Wo Contrast  02/05/2016  CLINICAL DATA:  Multifocal embolic infarcts, splenic and bowel infarcts. Assess for further embolic ischemia. Worsening leg weakness. EXAM: MRI HEAD WITHOUT CONTRAST TECHNIQUE: Multiplanar, multiecho pulse sequences of the brain and surrounding structures were obtained without intravenous contrast. COMPARISON:  CT HEAD Jan 25, 2016 FINDINGS: INTRACRANIAL CONTENTS: Patchy reduced diffusion in bilateral cerebellum, vermis. Confluent reduced diffusion within frontal, parietal, occipital lobes. Extension of reduced diffusion to RIGHT posterior temporal lobe. Patchy reduced diffusion splenium of the corpus callosum. Patchy reduced diffuse in LEFT posterior temporal lobe. Patchy reduced diffusion LEFT basal ganglia. All areas of reduced diffusion demonstrate low ADC values. Patchy areas of susceptibility artifact within areas of infarct associated with T1 shortening. Local sulcal effacement. 3 mm RIGHT to LEFT midline shift with mass effect  on RIGHT lateral ventricle atrium. No ventricular entrapment or hydrocephalus. No abnormal extra-axial fluid collections. Major intracranial vascular flow voids present at skull base. ORBITS: Trace RIGHT maxillary sinus mucosal thickening. Small bilateral mastoid effusions. SINUSES: The mastoid air-cells and included paranasal sinuses are well-aerated. SKULL/SOFT TISSUES: No abnormal sellar expansion. No suspicious calvarial bone marrow  signal. Craniocervical junction maintained. IMPRESSION: Extensive supra and infratentorial acute infarcts, including RIGHT greater than LEFT MCA and PCA territories consistent with embolic phenomena. Petechial hemorrhage without frank hemorrhagic conversion. 3 mm RIGHT to LEFT midline shift without ventricular entrapment. These results will be called to the ordering clinician or representative by the Radiologist Assistant, and communication documented in the PACS or zVision Dashboard. Electronically Signed   By: Elon Alas M.D.   On: 02/05/2016 23:24   Mr Cervical Spine Wo Contrast  02/06/2016  CLINICAL DATA:  Extensive cerebral and abdominal infarcts including ischemic bowel. Leg weakness. EXAM: MRI CERVICAL, THORACIC SPINE WITHOUT CONTRAST TECHNIQUE: Multiplanar and multiecho pulse sequences of the cervical spine, to include the craniocervical junction and cervicothoracic junction, and thoracic spine, were obtained without intravenous contrast. COMPARISON:  None. FINDINGS: MRI CERVICAL SPINE FINDINGS ALIGNMENT: Straightened cervical lordosis.  No malalignment. VERTEBRAE/DISCS: Vertebral bodies are intact. Intervertebral disc morphology's and signal are normal. CORD:Cervical spinal cord is normal morphology and signal characteristics from the cervicomedullary junction to level of T1-2, the most caudal well visualized level. POSTERIOR FOSSA, VERTEBRAL ARTERIES, PARASPINAL TISSUES: No MR findings of ligamentous injury. Vertebral artery flow voids present. Included view of  the posterior fossa demonstrates multiple cerebellar infarcts. Subcutaneous fat interstitial edema. DISC LEVELS: Mildly motion degraded examination. C2-3: No disc bulge, canal stenosis nor neural foraminal narrowing. C3-4 and C4-5: Tiny central disc protrusion without canal stenosis or neural foraminal narrowing. C5-6: Minimal annular bulging without canal stenosis neural foraminal narrowing. C6-7: Minimal annular bulging and tiny central disc protrusion without canal stenosis or neural foraminal narrowing. C7-T1: No disc bulge, canal stenosis nor neural foraminal narrowing. MRI THORACIC SPINE FINDINGS ALIGNMENT: Maintenance of the thoracic kyphosis. No malalignment. VERTEBRAE/DISCS: Vertebral bodies are intact. Intervertebral disc morphology's and signal are normal. Scattered chronic Schmorl's nodes. Mild subacute discogenic endplate changes at Y6-9, T10-11, T11-12. CORD: Thoracic spinal cord is normal morphology and signal characteristics to the level the conus medullaris which terminates at T12-L1. PREVERTEBRAL AND PARASPINAL SOFT TISSUES: Mild subcutaneous fat interstitial dependent edema. Prominent upper and mid thoracic dorsal epidural fat. Life-support lines in place, not tailored for evaluation. DEGENERATIVE CHANGE: No canal stenosis or neural foraminal narrowing at any level. At T6-7 is small RIGHT central disc protrusion. At T7-8 is small LEFT central disc protrusion and annular fissure. IMPRESSION: MRI CERVICAL SPINE: No MR findings of spinal cord infarct though diffusion weighted sequences not obtained. Negative noncontrast MRI of the cervical spine. MRI THORACIC SPINE: No MR findings of spinal cord infarct though diffusion-weighted sequences not obtained. Small mid thoracic disc protrusions without canal stenosis, neural foraminal narrowing or acute osseous process. Electronically Signed   By: Elon Alas M.D.   On: 02/06/2016 00:53   Mr Thoracic Spine Wo Contrast  02/06/2016  CLINICAL DATA:   Extensive cerebral and abdominal infarcts including ischemic bowel. Leg weakness. EXAM: MRI CERVICAL, THORACIC SPINE WITHOUT CONTRAST TECHNIQUE: Multiplanar and multiecho pulse sequences of the cervical spine, to include the craniocervical junction and cervicothoracic junction, and thoracic spine, were obtained without intravenous contrast. COMPARISON:  None. FINDINGS: MRI CERVICAL SPINE FINDINGS ALIGNMENT: Straightened cervical lordosis.  No malalignment. VERTEBRAE/DISCS: Vertebral bodies are intact. Intervertebral disc morphology's and signal are normal. CORD:Cervical spinal cord is normal morphology and signal characteristics from the cervicomedullary junction to level of T1-2, the most caudal well visualized level. POSTERIOR FOSSA, VERTEBRAL ARTERIES, PARASPINAL TISSUES: No MR findings of ligamentous injury. Vertebral artery flow voids present. Included view of the posterior fossa demonstrates multiple cerebellar infarcts. Subcutaneous  fat interstitial edema. DISC LEVELS: Mildly motion degraded examination. C2-3: No disc bulge, canal stenosis nor neural foraminal narrowing. C3-4 and C4-5: Tiny central disc protrusion without canal stenosis or neural foraminal narrowing. C5-6: Minimal annular bulging without canal stenosis neural foraminal narrowing. C6-7: Minimal annular bulging and tiny central disc protrusion without canal stenosis or neural foraminal narrowing. C7-T1: No disc bulge, canal stenosis nor neural foraminal narrowing. MRI THORACIC SPINE FINDINGS ALIGNMENT: Maintenance of the thoracic kyphosis. No malalignment. VERTEBRAE/DISCS: Vertebral bodies are intact. Intervertebral disc morphology's and signal are normal. Scattered chronic Schmorl's nodes. Mild subacute discogenic endplate changes at W2-9, T10-11, T11-12. CORD: Thoracic spinal cord is normal morphology and signal characteristics to the level the conus medullaris which terminates at T12-L1. PREVERTEBRAL AND PARASPINAL SOFT TISSUES: Mild  subcutaneous fat interstitial dependent edema. Prominent upper and mid thoracic dorsal epidural fat. Life-support lines in place, not tailored for evaluation. DEGENERATIVE CHANGE: No canal stenosis or neural foraminal narrowing at any level. At T6-7 is small RIGHT central disc protrusion. At T7-8 is small LEFT central disc protrusion and annular fissure. IMPRESSION: MRI CERVICAL SPINE: No MR findings of spinal cord infarct though diffusion weighted sequences not obtained. Negative noncontrast MRI of the cervical spine. MRI THORACIC SPINE: No MR findings of spinal cord infarct though diffusion-weighted sequences not obtained. Small mid thoracic disc protrusions without canal stenosis, neural foraminal narrowing or acute osseous process. Electronically Signed   By: Elon Alas M.D.   On: 02/06/2016 00:53   Dg Chest Port 1 View  02/07/2016  CLINICAL DATA:  Respiratory failure. EXAM: PORTABLE CHEST 1 VIEW COMPARISON:  02/06/2016. FINDINGS: Endotracheal tube, NG tube, right IJ line in stable position. Interim clearing of changes of congestive heart failure. Low lung volumes with mild bibasilar atelectasis. No pleural effusion or pneumothorax. IMPRESSION: 1. Lines and tubes in stable position. 2. Interim clearing of changes of congestive heart failure. Low lung volumes with mild bibasilar atelectasis. Electronically Signed   By: Marcello Moores  Register   On: 02/07/2016 07:20   Dg Chest Portable 1 View  02/06/2016  CLINICAL DATA:  Respiratory failure. EXAM: PORTABLE CHEST 1 VIEW COMPARISON:  02/05/2016. FINDINGS: Endotracheal tube, NG tube, right IJ line stable position. Cardiomegaly with persistent bilateral pulmonary interstitial prominence consistent with congestive heart failure. Small left pleural effusion. No pneumothorax . IMPRESSION: 1. Lines and tubes in stable position. 2.Persistent changes of congestive heart failure with bilateral pulmonary interstitial edema and small left pleural effusion. No significant  change from prior exam. Electronically Signed   By: Belle Meade   On: 02/06/2016 07:41       PHYSICAL EXAM Elderly caucasian male who is intubated. Not sedated. . Afebrile. Head is nontraumatic. Neck is supple without bruit.    Cardiac exam no murmur or gallop. Lungs are clear to auscultation. Distal pulses are well felt. Neurological Exam ;  Patient is unresponsive. Eyes are closed. He is intubated. Patient opens eyes partially to sternal rub .he does not follow any commands.. He has slight left gaze and upward gaze deviation. Pupils irregular but reactive. Fundi were not visualized. He does not blink to threat bilaterally. No facial weakness. Tongue midline. Patient is able to move right side to painful stimuli and can withdraw left lower extremity minimally to painful stimuli. No movement in the left upper extremity to pain. Deep tendon flexes are 2+ on the right and 1+ on the left. Left plantar upgoing right is equivocal. Gait was not tested. ASSESSMENT/PLAN Mr. JOVANTE HAMMITT is a 60  y.o. 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 He had an emergent bowel resection with wound VAC. He did not receive IV t-PA due to recent stroke.   Recent Cryptogenic Stroke:   bilateral anterior and posterior circulation infarcts, embolic secondary to unknown source  MRI extensive scattered bilateral anterior and posterior circulation infarcts, L basal ganglia lacune, largest R occipital (at [facility])  MRA Unremarkable (at [facility])  Carotid Doppler  Neg (at [facility])  2D Echo  EF 60-65%. No source of embolus seen. PFO suspected as contrast shows intraatrial shunt (at [facility])  TEE no SOE, EF 50-55%. No intra-atrial shunt. No PFO seen (at [facility])  LE dopplers negative (at [place])  Repeat LE dopplers ordered  MRI  Extensive supra and infratentorial acute infarcts - R>L MCA and PCA  territories. Petechial hemorrhage. 44m R to L shift ([facility])  MR CS  negative  MR thoracic spine small thoracic disc protrusions, o/w normal  MRI angio pelvis negative  CTA head and neck 02/06/16 ;50% diameter stenosis proximal right internal carotid artery due to calcific stenosis. Moderate to severe stenosis in the right cavernous carotid due to calcific stenosis. 75% diameter stenosis proximal left internal carotid artery due to calcific stenosis. Moderate to severe stenosis left cavernous carotid due to calcific stenosis Moderate stenosis at the origin of the right vertebral artery. Moderate stenosis distal left vertebral artery. Scattered atherosclerotic disease throughout both vertebral arteries. No large vessel intracranial occlusion  EEG generalized background slowing, consistent with diffuse cerebral dysfunction. This is a nonspecific finding seen with toxic-metabolic etiologies but also pharmacologic effect from sedation.  Check 2D echo  Check vasculitic labs  CRP elevated 17.451, RPR neg, NH4 28, Vit B12 550, Vit D 16.5, Fer 421.5, Procalcitonin 0.17, ESR 15, UDS neg,   LDL 130 [place] [facility] 5.[facility] Diet NPO time specified .TPN (CLINIMIX-E) Adult .TPN (CLINIMIX-E) Adult  aspirin 81 mg daily prior to admission, now on aspirin 300 mg suppository daily  Ongoing aggressive stroke risk factor management  Therapy recommendations:  pending   Disposition:  pending   Postoperative ventilatory support ? LLL PNA  Hypotension/bradycardia Hx Hypertension  Variable BP - 79/47 ->188/79 past 24h  SBP 130s currently  Permissive hypertension (OK if < 220/120) but gradually normalize in 5-7 days  Long-term BP goal normotensive  Hyperlipidemia  Home meds:  lipitor 80  [facility]  Resume statin when able   Other Stroke Risk Factors  Hx stroke/TIA  01/25/2016 cryptogenic  infarcts  Family hx stroke (father)  PVD  ? 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 peritonitis  Recently elevated troponin without reduced cardiac function  Leukocytosis  Anemia of chronic disease  Thrombocytopenia   Elevated liver enzymes, synthetic liver dysfunction  Severe protein calorie malnutrition   Hospital day # 8 I have personally examined this patient, reviewed notes, independently viewed imaging studies, participated in medical decision making and plan of care. I have made any additions or clarifications directly to the above note. He has unfortunately had neurological worsening with now multiple right hemispheric as well as smaller left hemispheric infarcts in right cerebellar infarcts with a neurological exam being quite poor. The source of patient's  multiple cerebral and splenic and probable infarcts is still undetermined despite extensive testing. Since patient is clearly has had significant extension of his recent strokes  CT angiogram findings: Confused the picture as they seem to be discordant with the findings on carotid ultrasound and MR angiogram done recently at [facility] but seemed to agree with MRA done at [facility] on 01/25/16. I recommend checking repeat carotid ultrasound here. The patient's by cerebellar infarcts could potentially be explained by watershed mechanism but splenic infarct and embolus to superior mesenteric artery could still argue more towards the central cardiac source of embolism. Checking transesophageal echocardiogram or cardiac MRI may help in determining cardiac source of embolism but is unlikely to change his prognosis since his neurological exam looks quite poor..Check repeat troponin levels I had a long discussion in the bedside with the patient's wife and son and answered  questions about his prognosis. I recommend family meeting tomorrow am and  unless there is significant neurological improvement patient is unlikely to survive without prolonged ventilatory support and nursing home care. Family understands his poor prognosis and are likely to make a decision about goals of care soon This patient is critically ill and at significant risk of neurological worsening, death and care requires constant monitoring of vital signs, hemodynamics,respiratory and cardiac monitoring, extensive review of multiple databases, frequent neurological assessment, discussion with family, other specialists and medical decision making of high complexity.I have made any additions or clarifications directly to the above note.This critical care time does not reflect procedure time, or teaching time or supervisory time of PA/NP/Med Resident etc but could involve care discussion time.  I spent 45 minutes of neurocritical care time  in the care of  this patient.      Antony Contras, MD Medical Director [facility] Pager: [phone] 02/07/2016 3:58 PM     To contact Stroke Continuity provider, please refer to [email or web address] After hours, contact General Neurology

## 2016-02-07 NOTE — Plan of Care (Signed)
Problem: Education: Goal: Knowledge of disease or condition will improve Outcome: Progressing Patient's family updated on new diagnose of CVA by neurology team 02/06/2016. Family (patient's spouse and sister) is aware of plan of care. Support offered to family; chaplain is requested to provide additional support to family at this time.

## 2016-02-07 NOTE — Progress Notes (Signed)
eLink Physician-Brief Progress Note Patient Name: Brian BillMichael E Baxter DOB: Nov 28, 1955 MRN: [id number]   Date of Service  02/07/2016  HPI/Events of Note  ABG on 40%/PRVC 14/TV 570/P 5 = 7.6/24.8/162/24.8. Note that the patient is breathing at a rate of 24.  eICU Interventions  Will order: 1. Fentanyl IV infusion. Titrate to RASS = 0 to -1.     Intervention Category Major Interventions: Acid-Base disturbance - evaluation and management;Respiratory failure - evaluation and management  Timmie Dugue Eugene 02/07/2016, 4:04 AM

## 2016-02-07 NOTE — Progress Notes (Signed)
CRITICAL VALUE ALERT  Critical value received:  ABG Results from 0337  Date of notification:  02/07/2016  Time of notification:  0400  Critical value read back:Yes.    Nurse who received alert:  Feliberto GottronBrad Laquanna Veazey Rn  MD notified (1st page):  Dr. Dellie CatholicSommers  Time of first page:  0400  MD notified (2nd page):  Time of second page:  Responding MD:  Dr. Dellie CatholicSommers  Time MD responded:  0400

## 2016-02-07 NOTE — Progress Notes (Signed)
RT note-placed back to full support due to increased RR in the thirties.

## 2016-02-07 NOTE — Progress Notes (Signed)
VASCULAR LAB PRELIMINARY  PRELIMINARY  PRELIMINARY  PRELIMINARY  Carotid duplex completed.    Preliminary report:  Bilateral:  1-39% ICA stenosis. Acoustic shadowing may obscure higher velocities  Vertebral artery flow is antegrade.     Betania Dizon, RVS 02/07/2016, 5:58 PM

## 2016-02-08 ENCOUNTER — Inpatient Hospital Stay (HOSPITAL_COMMUNITY): Payer: BLUE CROSS/BLUE SHIELD

## 2016-02-08 DIAGNOSIS — Z66 Do not resuscitate: Secondary | ICD-10-CM

## 2016-02-08 DIAGNOSIS — Z515 Encounter for palliative care: Secondary | ICD-10-CM

## 2016-02-08 DIAGNOSIS — M7989 Other specified soft tissue disorders: Secondary | ICD-10-CM

## 2016-02-08 DIAGNOSIS — M79609 Pain in unspecified limb: Secondary | ICD-10-CM

## 2016-02-08 LAB — COMPREHENSIVE METABOLIC PANEL
ALK PHOS: 95 U/L (ref 38–126)
ALT: 107 U/L — AB (ref 17–63)
ANION GAP: 6 (ref 5–15)
AST: 126 U/L — ABNORMAL HIGH (ref 15–41)
Albumin: 1.5 g/dL — ABNORMAL LOW (ref 3.5–5.0)
BILIRUBIN TOTAL: 2.4 mg/dL — AB (ref 0.3–1.2)
BUN: 39 mg/dL — ABNORMAL HIGH (ref 6–20)
CALCIUM: 7.5 mg/dL — AB (ref 8.9–10.3)
CO2: 29 mmol/L (ref 22–32)
CREATININE: 1 mg/dL (ref 0.61–1.24)
Chloride: 100 mmol/L — ABNORMAL LOW (ref 101–111)
Glucose, Bld: 145 mg/dL — ABNORMAL HIGH (ref 65–99)
Potassium: 3.2 mmol/L — ABNORMAL LOW (ref 3.5–5.1)
SODIUM: 135 mmol/L (ref 135–145)
TOTAL PROTEIN: 5.2 g/dL — AB (ref 6.5–8.1)

## 2016-02-08 LAB — CBC
HCT: 28 % — ABNORMAL LOW (ref 39.0–52.0)
HEMOGLOBIN: 9.1 g/dL — AB (ref 13.0–17.0)
MCH: 29.4 pg (ref 26.0–34.0)
MCHC: 32.5 g/dL (ref 30.0–36.0)
MCV: 90.3 fL (ref 78.0–100.0)
Platelets: 68 10*3/uL — ABNORMAL LOW (ref 150–400)
RBC: 3.1 MIL/uL — AB (ref 4.22–5.81)
RDW: 15.2 % (ref 11.5–15.5)
WBC: 21.6 10*3/uL — AB (ref 4.0–10.5)

## 2016-02-08 LAB — GLUCOSE, CAPILLARY
GLUCOSE-CAPILLARY: 122 mg/dL — AB (ref 65–99)
GLUCOSE-CAPILLARY: 131 mg/dL — AB (ref 65–99)
GLUCOSE-CAPILLARY: 163 mg/dL — AB (ref 65–99)
Glucose-Capillary: 139 mg/dL — ABNORMAL HIGH (ref 65–99)

## 2016-02-08 LAB — ANTINUCLEAR ANTIBODIES, IFA: ANA Ab, IFA: NEGATIVE

## 2016-02-08 MED ORDER — SODIUM CHLORIDE 0.9 % IV SOLN
1.0000 mg/h | INTRAVENOUS | Status: DC
  Administered 2016-02-08: 1 mg/h via INTRAVENOUS
  Filled 2016-02-08 (×2): qty 10

## 2016-02-08 MED ORDER — TRACE MINERALS CR-CU-MN-SE-ZN 10-1000-500-60 MCG/ML IV SOLN
INTRAVENOUS | Status: DC
  Filled 2016-02-08: qty 1992

## 2016-02-08 MED ORDER — FENTANYL CITRATE (PF) 100 MCG/2ML IJ SOLN: 100.0000 ug | INTRAMUSCULAR | Status: DC | PRN

## 2016-02-08 MED ORDER — SODIUM CHLORIDE 0.9 % IV SOLN
0.0000 ug/h | INTRAVENOUS | Status: DC
  Administered 2016-02-08: 350 ug/h via INTRAVENOUS
  Administered 2016-02-09: 500 ug/h via INTRAVENOUS
  Administered 2016-02-09: 350 ug/h via INTRAVENOUS
  Administered 2016-02-09: 500 ug/h via INTRAVENOUS
  Filled 2016-02-08 (×5): qty 50

## 2016-02-08 MED ORDER — POTASSIUM CHLORIDE 10 MEQ/50ML IV SOLN
10.0000 meq | INTRAVENOUS | Status: AC
  Administered 2016-02-08 (×4): 10 meq via INTRAVENOUS
  Filled 2016-02-08 (×4): qty 50

## 2016-02-08 MED ORDER — IOPAMIDOL (ISOVUE-370) INJECTION 76%
50.0000 mL | Freq: Once | INTRAVENOUS | Status: AC | PRN
  Administered 2016-02-07: 50 mL via INTRAVENOUS

## 2016-02-08 MED ORDER — GLYCOPYRROLATE 0.2 MG/ML IJ SOLN
0.4000 mg | Freq: Four times a day (QID) | INTRAMUSCULAR | Status: DC
  Administered 2016-02-08 – 2016-02-09 (×4): 0.4 mg via INTRAVENOUS
  Filled 2016-02-08 (×6): qty 2

## 2016-02-08 MED ORDER — SODIUM CHLORIDE 0.9 % IV SOLN
1.0000 mg/h | INTRAVENOUS | Status: DC
  Administered 2016-02-08: 4 mg/h via INTRAVENOUS
  Administered 2016-02-09: 6 mg/h via INTRAVENOUS
  Administered 2016-02-09: 8 mg/h via INTRAVENOUS
  Filled 2016-02-08 (×3): qty 10

## 2016-02-08 NOTE — Progress Notes (Signed)
Family to consider goals of care.  There is little to do from a surgical standpoint at this time.  If we are to continue aggress vie care, then call us back and we will continue to follow this patient and likely try trickle tube feedings.  He will need a feeding tube  And likely tracheostomy at some point if continued care is elected.  Considering that the family is likely to withdraw care, will sign off for now.    Marta LamasJames O. Gae BonWyatt, III, MD, FACS [phone]--pager [phone]--office [facility]

## 2016-02-08 NOTE — Progress Notes (Signed)
VASCULAR LAB PRELIMINARY  PRELIMINARY  PRELIMINARY  PRELIMINARY  Bilateral upper extremity venous duplex has been completed.    DVT appears to be identified in the Right upper extremity which indicates acute thrombus in the Internal jugular vein, partial thrombus in the subclavain vein and axillary vein.  Superificial thrombus in the Cephalic vein.  No evidence of DVT in the left arm.  Exam was limit and difficult due to central line in neck on right side and IV site  In the basilic vein appears to be patent. Left arm has Iv in the brachial artery appears to be patent.  Nhia Heaphy, RVT, RDMS 02/08/2016, 11:30 AM

## 2016-02-08 NOTE — Procedures (Signed)
Extubation Procedure Note  Patient Details:   Name: Brian Baxter DOB: March 12, 1956 MRN: [id number]   Airway Documentation: Extubated patient at family request per order comfort care.      Evaluation  O2 sats: currently acceptable Complications: No apparent complications Patient did not tolerate procedure well. Bilateral Breath Sounds: Rhonchi   No  Brian Baxter, Brian Baxter 02/08/2016, 1:42 PM

## 2016-02-08 NOTE — Progress Notes (Signed)
Patient ID: Brian Baxter, male   DOB: 11-10-1955, 60 y.o.   MRN: [id number] Hemodynamically stable overnight. I spoke with 2 of his sisters at the bedside. Family meeting today with stroke service regarding goals of care. Explained that from his MRI  That his neurologic recovery would be very poor. I'm now spoken with his wife and son and 2 sisters and they all do not want aggressive care if chances for recovery arepoor

## 2016-02-08 NOTE — Progress Notes (Signed)
[facility] FOR [facility] LAB REPLACEMENT ONLY  The [facility] does [facility] based on the criteria listed below:   1. Is GFR >/= 40 ml/min? Yes.    Patient's GFR today is >60 2. Is urine output >/= 0.5 ml/kg/hr for the last 6 hours? Yes.   Patient's UOP is 0.93 ml/kg/hr 3. Is BUN < 60 mg/dL? Yes.    Patient's BUN today is 39 4. Abnormal electrolyte  K 3.2 5. Ordered repletion with: per protocol 6. If a panic level lab has been reported, has the CCM MD in charge been notified? Yes.  .   Physician:  Tonny BranchSommer  Dellene Mcgroarty, Lang Snowlizabeth McEachran 02/08/2016 6:07 AM

## 2016-02-08 NOTE — Progress Notes (Signed)
PULMONARY / CRITICAL CARE MEDICINE   Name: BRAXTIN BAMBA MRN: [id number] DOB: 07-14-1956    ADMISSION DATE:  02/13/2016 CONSULTATION DATE:  01/31/2016  REFERRING MD:  Dr. Arbie Cookey  CHIEF COMPLAINT:  Abdominal pain  HISTORY OF PRESENT ILLNESS:   60 year old male with hx HTN, tobacco abuse admitted 01/29/2016 in the setting of small bowel ischemia and possible right kidney ischemia. He was taken urgently to OR with gen surgery and vascular, abd left open with wound vac.  Tx to ICU intubated, on pressors.  Previous admission to outside hospital 5/31 where MRI brain noted multiple strokes.  During that admit he had extensive w/u to look for etiology of clot which was ess negative including neg TEE.  Current admit - sedation holiday concerning for pt not following commands, became tachypneic and sedation restarted.   SUBJECTIVE:  RN reports pt not following commands on WUA, fever 101.4, ABG with respiratory alkalosis, elevated troponin (3.4 >> 3.34), lactic acid 1.3, K 2.7, WBC 22.3, platelets 62  VITAL SIGNS: BP 112/54 mmHg  Pulse 84  Temp(Src) 98.7 F (37.1 C) (Axillary)  Resp 22  Ht  (1.753 m)  Wt 71.7 kg (158 lb 1.1 oz)  BMI 23.33 kg/m2  SpO2 100%  HEMODYNAMICS: CVP:  [1 mmHg-4 mmHg] 3 mmHg  VENTILATOR SETTINGS: Vent Mode:  [-] PRVC FiO2 (%):  [40 %] 40 % Set Rate:  [14 bmp] 14 bmp Vt Set:  [530 mL-570 mL] 530 mL PEEP:  [5 cmH20] 5 cmH20 Pressure Support:  [5 cmH20] 5 cmH20 Plateau Pressure:  [9 cmH20-17 cmH20] 12 cmH20  INTAKE / OUTPUT: I/O last 3 completed shifts: In: 3673.2 [I.V.:343.2; NG/GT:90; IV Piggyback:1200] Out: 78295 [Urine:9550; Emesis/NG output:2600; Drains:50]  PHYSICAL EXAMINATION: General:  On vent, NAD, unresponsive. Neuro:  RASS -1, does not open eyes, does not follow commands  HEENT:  Normocephalic/atraumatic, endotracheal tube in place Cardiovascular:  Regular rate and rhythm, no murmurs gallops rubs Lungs:  Resps even non labored on vent,  clear anterior, diminished bases  Abdomen:  No bowel sounds, abd incision with VAC  Musculoskeletal:  Normal bulk and tone  LABS:  BMET  Recent Labs Lab 02/07/16 1544 02/07/16 2235 02/08/16 0438  NA 137 134* 135  K 2.8* 3.1* 3.2*  CL 98* 98* 100*  CO2 BUN 36* 38* 39*  CREATININE 1.09 1.00 1.00  GLUCOSE 151* 132* 145*   Electrolytes  Recent Labs Lab 02/04/16 0410  02/06/16 0418 02/07/16 0358 02/07/16 1544 02/07/16 2235 02/08/16 0438  CALCIUM  --   < > 7.1* 7.8* 7.6* 7.4* 7.5*  MG 1.9  --  2.3 2.3  --   --   --   PHOS 2.9  --  5.8* 5.0*  --   --   --   < > = values in this interval not displayed.  CBC  Recent Labs Lab 02/06/16 0418 02/07/16 0358 02/08/16 0438  WBC 19.6* 22.3* 21.6*  HGB 9.0* 10.5* 9.1*  HCT 26.2* 30.7* 28.0*  PLT 42* 62* 68*    Coag's  Recent Labs Lab 02/02/16 0338  APTT 35  INR 1.43   Sepsis Markers  Recent Labs Lab 02/07/16 0830  LATICACIDVEN 1.3   ABG  Recent Labs Lab 02/06/16 0406 02/07/16 0337 02/07/16 1510  PHART 7.499* 7.602* 7.593*  PCO2ART 30.0* 24.8* 32.9*  PO2ART 89.0 162* 169.0*   Liver Enzymes  Recent Labs Lab 02/06/16 0418 02/07/16 0358 02/08/16 0438  AST 135* 225* 126*  ALT 76*  134* 107*  ALKPHOS 79 114 95  BILITOT 3.5* 3.9* 2.4*  ALBUMIN 1.1* 1.6* 1.5*    Cardiac Enzymes  Recent Labs Lab 02/07/16 0943 02/07/16 1513 02/07/16 2235  TROPONINI 3.34* 3.08* 2.72*   Glucose  Recent Labs Lab 02/07/16 1628 02/07/16 1934 02/07/16 2340 02/08/16 0358 02/08/16 0729 02/08/16 1121  GLUCAP 150* 141* 131* 139* 163* 122*   Imaging Dg Chest Port 1 View  02/08/2016  CLINICAL DATA:  Hypoxia EXAM: PORTABLE CHEST 1 VIEW COMPARISON:  February 07, 2016 FINDINGS: Endotracheal tube tip is 2.5 cm above the carina. Nasogastric tube tip and side port are in the stomach. Central catheter tip is in the superior vena cava. No pneumothorax. There is atelectatic change in the left base. The lungs  elsewhere clear. Heart is upper normal in size with pulmonary vascularity within normal limits. No adenopathy. IMPRESSION: Tube and catheter positions as described without pneumothorax. Mild left base atelectasis. Lungs elsewhere clear. Stable cardiac silhouette. Electronically Signed   By: Bretta Bang III M.D.   On: 02/08/2016 07:31     STUDIES:  01/26/2016 outside hospital TTE: Left atrial enlargement, contrast suggestive of patent foramen ovale 01/26/2016 bilateral carotid ultrasound: No significant stenosis 01/26/2016 ultrasound Doppler legs>  negative for  DVT 01/27/2016 MR angiogram pelvis: Mild compression of the left common iliac vein without significant narrowing or thrombosis 01/27/2016 outside hospital TTE> Normal valves, left atrial enlargement, there is no abnormal contrast to suggest intra-atrial shunting 6/5 CT abdomen OSH> CT angiogram findings worrisome for superior mesenteric artery clot, small bowel ischemia, infarcts to kidney and spleen. 6/12  EEG >> generalized background slowing, consistent with diffuse cerebral dysfunction, nonspecific finding with toxic-metabolic etiologies, sedation 1/61  ECHO with Bubble Study >>  6/13  UE Venous Duplex >>  6/13  LE Venous Duplex >> 6/13  LE Arterial Duplex >>   CULTURES: None  ANTIBIOTICS: Ertapenem 6/6 >>  Vanc 6/12 >>  SIGNIFICANT EVENTS: 6/05 CTA abd >> worrisome for superior mesenteric artery clot 6/05 OR >> small bowel resection, aortomesenteric bypass  6/09 back to OR >> abd closure   LINES/TUBES: ETT 6/5 >>> TLC R IJ >> L brachial a-line 6/5>>>  DISCUSSION:  60 year old male who has small bowel ischemia, possible renal infarct in the setting of the superior mesenteric artery clot. This occurs several days after recent hospitalization for stroke thought to be embolic.  Uncertain etiology of hypercoagulable state.  Remains critically ill, s/p surgical repair.     ASSESSMENT /  PLAN:  PULMONARY A: Mechanical ventilatory support postoperative Respiratory Alkalosis - suspect in setting of neuro findings, fever History of tobacco use ? Left lower lobe PNA vs Pulmonary Edema  P:   Diuresis as below  Titrate O2 for sat of 88-92%. Family discussing goals of care with neuro and primary, will hold off weaning till discussion.  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 Repeat ECHO with bubble study per cards, pending.  RENAL A:   Concern for partial right renal infarct on CT angiogram, normal renal function P:   Monitor BMET and UOP Replace electrolytes as needed Hold lasix until GOC meeting is complete.  GASTROINTESTINAL A:   Small bowel ischemia secondary to superior mesenteric artery clot Post laparotomy with open abd P:  Postoperative care per general surgery. Nothing by mouth. PPI. Continue TPN for now.  HEMATOLOGIC A:   Concern for hypercoagulable state Stents of the workup for embolic cause of multiple arterial strokes negative at outside hospital 3-4 days prior to admission Thrombocytopenia  P:  Hypercoagulability panel > negative. Hold heparin until okay by neurology, presumably June 13 (14 days post stroke).  Await recommendations from Neuro.  Hematology feels clots are due to a cardiac origin.  INFECTIOUS A:   Peritonitis Fever - new on 6/12, vanco added  P:   Monitor fever curve / WBC ABX / cultures as above  Monitor abd wound  ENDOCRINE A:   No acute  issues P:   Monitor glucose  NEUROLOGIC A:   Recent stroke P:   RASS goal: 0 Lighten sedation as able. Fentanyl drip Versed PRN  FAMILY  - Updates:  no family available 6/13.  - Inter-disciplinary family meet or Palliative Care meeting due by:  day 7  The patient is critically ill with multiple organ systems failure and requires high complexity decision making for assessment and support, frequent evaluation and titration of therapies, application of advanced monitoring technologies and extensive interpretation of multiple databases.   Critical Care Time devoted to patient care services described in this note is  35  Minutes. This time reflects time of care of this signee Dr Koren BoundWesam Daric Koren. This critical care time does not reflect procedure time, or teaching time or supervisory time of PA/NP/Med student/Med Resident etc but could involve care discussion time.  Alyson ReedyWesam G. Cheryel Kyte, M.D. [facility]. Pager: [phone]. After hours pager: [phone].

## 2016-02-08 NOTE — Progress Notes (Signed)
   02/08/16 1100  Clinical Encounter Type  Visited With Family  Visit Type Spiritual support;Initial  Referral From Chaplain  Spiritual Encounters  Spiritual Needs Prayer;Emotional  Stress Factors  Family Stress Factors Loss;Major life changes  Chaplain intern Gaynell FaceMarshall had been looking for opportunity to visit patient and family and had not found one. Chaplain visited with nurse today and learned that family had just had palliative care meeting and were in conference room. Spoke to several members of the family, including wife, two sisters, and daughter-in-law and niece. Prayed with them and offered continuing support as they go through this experience. Aeon Koors, Chaplain

## 2016-02-08 NOTE — Progress Notes (Signed)
Patient extubated for comfort care. Fentanyl and Versed drips on. Family at bedside. Comfort and support provided to patient and family. Family remains at bedside.

## 2016-02-08 NOTE — Consult Note (Signed)
Consultation Note Date: 02/08/2016   Patient Name: Brian Baxter  DOB: 11/30/1955  MRN: [id number]  Age / Sex: 60 y.o., male  PCP: Algis Greenhouse, MD Referring Physician: Rosetta Posner, MD  Reason for Consultation: Establishing goals of care, Non pain symptom management, Pain control and Psychosocial/spiritual support  HPI/Patient Profile:   Brian Baxter is a 60 y.o. male with a history of recent stroke on May 31. He was admitted with multiple embolic strokes which was worked up at [facility]. He underwent echo, TEE showing no source of emboli. These were in both the anterior and posterior circulation and in both cerebral hemispheres. They did an MRI pelvis looking for DVT that was not visible otherwise and this was negative. MRA head with only mild atherosclerotic changes of the ICAs. Carotid Dopplers with no stenosis. LDL 134  The largest was noted to be 2.4 x 4.2 cm the right occipital lobe. He then went home and presented to [facility] with abdominal pain. 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 He was transferred to [facility] for IV TPA administration, but given his recent CVA that was not possible. He was seen by Dr. early of vascular surgery who performed bowel resection with a wound VAC left in place.   Patient is intubated. He remains unresponsive.  Unfortunately he has  had neurological worsening with now multiple right hemispheric as well as smaller left hemispheric infarcts in right cerebellar infarcts with a neurological exam being quite poor/ per Dr Leonie Man  The source of patient's multiple cerebral and splenic and probable infarcts is still undetermined despite extensive testing.  Family is faced with advanced directive and EOL decisions    Clinical Assessment and Goals of Care:    This NP Wadie Lessen reviewed medical  records, received report from team, assessed the patient and then meet at the patient's bedside along with his wife, son and wife,  and three sisters  to discuss diagnosis, prognosis, GOC, EOL wishes disposition and options.  A  discussion was had today regarding advanced directives.    The difference between a aggressive medical intervention path  and a palliative comfort care path for this patient at this time was had.  Values and goals of care important to patient and family were attempted to be elicited.  Natural trajectory and expectations at EOL were discussed.  Questions and concerns addressed.   Family encouraged to call with questions or concerns.  PMT will continue to support holistically.    SUMMARY OF RECOMMENDATIONS    - full shift to comfort and liberate from the vent.  Wife is very comfortable with this decision knowing her husband's previously communicated wishes for a natural death.  -if patient stabilizes consider tranfer to [place]:   DNR-documented today     Symptom Management:    Dyspnea/Pain: Fentanyl infusion with titration order for comfort Agitation:  Versed infusion with titration orders Terminal secretions: Rubinol 0.'4mg'$   IV TID  Palliative Prophylaxis:   Frequent Pain Assessment and Oral Care  Additional Recommendations (Limitations, Scope, Preferences):  Full Comfort Care  Psycho-social/Spiritual:    Desire for further Chaplaincy support:yes   Prognosis:   Hours - Days  Discharge Planning: Anticipated Hospital Death      Primary Diagnoses: Present on Admission:  . Splenic infarction . 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 Essential hypertension . CVA (cerebral vascular accident) ([place]) . 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 Abdominal pain  I have reviewed the medical record, interviewed the patient and family, and examined the patient. The following aspects are  pertinent.  Past Medical History  Diagnosis Date  . Hypertension   . TIA (transient ischemic attack) june 2017  . 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 CVA (cerebral infarction)   . 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 Marital Status: Married    Spouse Name: N/A  . Number of Children: N/A  . Years of Education: N/A   Social History Main Topics  . Smoking status: Current Every Day Smoker -- 1.00 packs/day for 30 years    Types: Cigarettes  . Smokeless tobacco: Never Used  . Alcohol Use: Yes     Comment: monthly  . Drug Use: No  . Sexual Activity: Yes    Birth Control/ Protection: None   Other Topics Concern  . None   Social History Narrative   Family History  Problem Relation Age of Onset  . Stroke Father    Scheduled Meds: . antiseptic oral rinse  7 mL Mouth Rinse 10 times per day  . chlorhexidine gluconate (SAGE KIT)  15 mL Mouth Rinse BID  . sodium chloride flush  3 mL Intravenous Q12H   Continuous Infusions: . Marland KitchenTPN (CLINIMIX-E) Adult 60 mL/hr at 02/08/16 1000  . fentaNYL infusion INTRAVENOUS 100 mcg/hr (02/08/16 1120)  . midazolam (VERSED) infusion     PRN Meds:.acetaminophen **OR** acetaminophen, albuterol, alum & mag hydroxide-simeth, fentaNYL (SUBLIMAZE) injection, hydrALAZINE, metoprolol, midazolam, ondansetron **OR** ondansetron (ZOFRAN) IV, phenol, sodium chloride flush Medications Prior to Admission:  Prior to Admission medications   Medication Sig Start Date End Date Taking? Authorizing Provider  aspirin 81 MG tablet Take 81 mg by mouth daily.   Yes Historical Provider, MD  atorvastatin (LIPITOR) 80 MG tablet Take 80 mg by mouth daily.   Yes Historical Provider, MD  meclizine (ANTIVERT) 32 MG tablet Take 32 mg by mouth 3 (three) times daily as needed.   Yes Historical Provider, MD  Olmesartan-Amlodipine-HCTZ (TRIBENZOR) 40-10-12.5 MG TABS Take by mouth every morning.   Yes Historical Provider, MD   pantoprazole (PROTONIX) 40 MG tablet Take 40 mg by mouth daily.   Yes Historical Provider, MD   Allergies  Allergen Reactions  . Atorvastatin Other (See Comments)    Myalgias (intolerance)  . Penicillins Hives and Rash  . Simvastatin Rash   Review of Systems  Unable to perform ROS: Acuity of condition    Physical Exam  Constitutional: He appears ill. He is intubated.  Cardiovascular: Tachycardia present.   Pulmonary/Chest: He is intubated.  Abdominal: Soft. Bowel sounds are decreased.  Neurological: He is unresponsive.  Skin: Skin is warm and dry.    Vital Signs: BP 112/54 mmHg  Pulse 84  Temp(Src) 98.7 F (37.1 C) (Axillary)  Resp 22  Ht '5\' 9"'$  (1.753 m)  Wt 71.7 kg (158 lb 1.1 oz)  BMI 23.33 kg/m2  SpO2 100% Pain Assessment:  CPOT   Pain Score: Asleep   SpO2: SpO2: 100 % O2 Device:SpO2: 100 % O2 Flow Rate: .O2 Flow Rate (L/min): 2 L/min  IO: Intake/output summary:  Intake/Output Summary (Last 24 hours) at 02/08/16 1220 Last data filed at 02/08/16 1000  Gross per 24 hour  Intake 2214.25 ml  Output   3550 ml  Net -1335.75 ml    LBM: Last BM Date: 02/06/16 Baseline Weight: Weight: 74.5 kg (164 lb 3.9 oz) Most recent weight: Weight: 71.7 kg (158 lb 1.1 oz)      Palliative Assessment/Data: 10 %    Discussed with Dr Donnetta Hutching and Dr Leonie Man and  April Pugh NP with Neuro  Time In: 1200 Time Out: 1400 Time Total: 120 min Greater than 50%  of this time was spent counseling and coordinating care related to the above assessment and plan.  Signed by: Wadie Lessen, NP   Please contact Palliative Medicine Team phone at [phone] for questions and concerns.  For individual provider: See Shea Evans

## 2016-02-08 NOTE — Progress Notes (Addendum)
ADDENDUM Withdrawing care. Spoke with RN- cut current TPN rate in half and allow bag to expire. Future TPN orders and labs d/c'd along with consult. Please re-consult if needed.  Ahnya Akre D. Zarinah Oviatt, PharmD, BCPS Clinical Pharmacist Pager: [phone] 02/08/2016 12:17 PM   PARENTERAL NUTRITION CONSULT NOTE  Pharmacy Consult for TPN Indication: Massive Bowel Resection   Allergies  Allergen Reactions  . Atorvastatin Other (See Comments)    Myalgias (intolerance)  . Penicillins Hives and Rash  . Simvastatin Rash    Patient Measurements: Height: _0  (175.3 cm) Weight: 158 lb 1.1 oz (71.7 kg) IBW/kg (Calculated) : 70.7   Vital Signs: Temp: 99 F (37.2 C) (06/14 0732) Temp Source: Oral (06/14 0732) BP: 124/51 mmHg (06/14 0739) Pulse Rate: 66 (06/14 0800) Intake/Output from previous day: 06/13 0701 - 06/14 0700 In: 2359.3 [I.V.:239.3; IV Piggyback:800; TPN:1320] Out: 5974 [Urine:4750; Emesis/NG output:1400] Intake/Output from this shift: Total I/O In: 127.5 [I.V.:7.5; TPN:120] Out: 200 [Urine:200]  Labs:  Recent Labs  02/06/16 0418 02/07/16 0358 02/08/16 0438  WBC 19.6* 22.3* 21.6*  HGB 9.0* 10.5* 9.1*  HCT 26.2* 30.7* 28.0*  PLT 42* 62* 68*     Recent Labs  02/06/16 0418 02/06/16 0500 02/07/16 0358 02/07/16 1544 02/07/16 2235 02/08/16 0438  NA 138  --  138 137 134* 135  K 3.8  --  2.7* 2.8* 3.1* 3.2*  CL 110  --  101 98* 98* 100*  CO2 20*  --  _1 GLUCOSE 132*  --  154* 151* 132* 145*  BUN 32*  --  34* 36* 38* 39*  CREATININE 1.04  --  1.12 1.09 1.00 1.00  CALCIUM 7.1*  --  7.8* 7.6* 7.4* 7.5*  MG 2.3  --  2.3  --   --   --   PHOS 5.8*  --  5.0*  --   --   --   PROT 4.3*  --  6.0*  --   --  5.2*  ALBUMIN 1.1*  --  1.6*  --   --  1.5*  AST 135*  --  225*  --   --  126*  ALT 76*  --  134*  --   --  107*  ALKPHOS 79  --  114  --   --  95  BILITOT 3.5*  --  3.9*  --   --  2.4*  PREALBUMIN 5.0*  --   --   --   --   --   TRIG  --  305*  --    --   --   --    Estimated Creatinine Clearance: 79.5 mL/min (by C-G formula based on Cr of 1).    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 Pharmacy consulted to start TPN.  Surgeries/Procedures: 6/9 Exp Lap & SBR   GI: POD #5. Has open abdomen and wound vac in place. prealbumin low at 5. Last BM 6/12. 1467m out of NGT  Endo: CBGs 136-163- increased with TPN, but still <180  Lytes: K 3.2- 4 runs ordered per ELink protocol, Mg 2.3, Phos still elevated at 5, CorCa ~9.5 (Ca x phos ~48)  Renal: SCr 1, UOP 2.8- now off of Lasix gtt. Watch renal function  Pulm: Intubated - 40% FiO2  Cards: HTN, VSS though BP can be on softer end. Holding anticoagulation- ?starting 14 days after initial stroke (would have been 6/13), but now with new infarcts. Remains on ASA PR.  Hepatobil: LFTs improving- AST 126, ALT107, TBili 2.4, alk phos remains normal, albumin 1.5. Trigs elevated to 305  Neuro: multiple embolic strokes from admission to [facility] on 5/31- in both cerebral hemispheres as well as in anterior and posterior circulation. Hematology following for workup of embolic events without clear source. Stroke team consulted- to repeat TTE, CTZ, dopplers, and vasculitic labs. EEG with generalized background slowly, consistent with diffuse cerebral dysfunction per neurologist report.  On fentantyl gtt, RASS -2, GCS 5   ID: WBC 21.6, tmax/24h 101.5- some improvement. On D#9 of ertapenem for peritonitis, D#3 of vancomycin for ?HCAP as well  Best Practices: Pepcid, MC   TPN Access: CVC triple lumen 6/5>>  TPN start date: 6/11>>   Nutritional Goals: (per RD recommendations 6/12) 2365 Kcal, 120-130g protein  Current Nutrition:  Clinimix E 5/15 at 60 mL/hr- provides 72g protein and 1022kcal. Holding  lipids for 7 days as patient is critically ill in ICU  Plan:  -Increase Clinimix E 5/15 to 1m/hr. Hold lipids for 7 days since patient is critically ill in the ICU. Day 4/7 of holding lipids. This will provide 99g protein and 1414kcal- Will be difficult to meet nutrition needs when holding lipids during this period. -MVI and full trace elements in TPN- watch, if he becomes jaundiced, will need to cut trace elements to half -Pepcid 444madded to TPN -Continue SSI -full TPN labs in the morning as per protocol   Faydra Korman D. Etoy Mcdonnell, PharmD, BCPS Clinical Pharmacist Pager: [phone]/14/2017 8:22 AM

## 2016-02-08 NOTE — Progress Notes (Signed)
STROKE TEAM PROGRESS NOTE   HISTORY OF PRESENT ILLNESS (per record) Brian Baxter is a 60 y.o. male with a history of recent stroke on May 31. He was admitted with multiple embolic strokes which was worked up at [facility]. He underwent echo, TEE showing no source of emboli. These were in both the anterior and posterior circulation and in both cerebral hemispheres. They did an MRI pelvis looking for DVT that was not visible otherwise and this was negative. MRA head with only mild atherosclerotic changes of the ICAs. Carotid Dopplers with no stenosis. LDL 134  The largest was noted to be 2.4 x 4.2 cm the right occipital lobe. He then went home and presented to [facility] with abdominal pain. 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 He was transferred to [facility] for IV TPA administration, but given his recent CVA that was not possible. He was seen by Dr. early of vascular surgery who performed bowel resection with a wound VAC left in place.  Given his open abdomen, he was heavily sedated following the procedure. On lightning sedation, however he has been noted to not be waking up. Given his recent embolic infarcts, neurology has been consulted for further evaluation.  His LKW for this presentation is unclear. Patient was not administered IV t-PA secondary to recent stroke. He was admitted post op for further evaluation and treatment.   SUBJECTIVE (INTERVAL HISTORY) Patient is intubated. He remains unresponsive. He has not shown any significant improvement over the last 3 days have been seeing him. Carotid ultrasound shows no significant extracranial carotid stenosis which is discordant with the CT angiogram done 2 days ago hence I do not believe the mechanism of strokes is a watershed but likely embolism from as yet unidentified source. Transthoracic echo showed normal ejection fraction without obvious cardiac source of embolism.  Elevated troponin seem to be trending down. EKG done yesterday did not show acute ischemia. I'm having a family meeting with multiple family members today to discuss prognosis OBJECTIVE Temp:  [98.1 F (36.7 C)-101.5 F (38.6 C)] 98.7 F (37.1 C) (06/14 1123) Pulse Rate:  [58-108] 64 (06/14 1200) Cardiac Rhythm:  [-] Normal sinus rhythm (06/14 0800) Resp:  [14-25] 15 (06/14 1200) BP: (101-135)/(49-71) 112/54 mmHg (06/14 1120) SpO2:  [99 %-100 %] 100 % (06/14 1200) Arterial Line BP: (87-182)/(43-84) 98/50 mmHg (06/14 1200) FiO2 (%):  [40 %] 40 % (06/14 1120) Weight:  [158 lb 1.1 oz (71.7 kg)] 158 lb 1.1 oz (71.7 kg) (06/14 0200)  CBC:   Recent Labs Lab 02/06/16 0418 02/07/16 0358 02/08/16 0438  WBC 19.6* 22.3* 21.6*  NEUTROABS 16.2*  --   --   HGB 9.0* 10.5* 9.1*  HCT 26.2* 30.7* 28.0*  MCV 87.0 86.5 90.3  PLT 42* 62* 68*    Basic Metabolic Panel:   Recent Labs Lab 02/06/16 0418 02/07/16 0358  02/07/16 2235 02/08/16 0438  NA 138 138  < > 134* 135  K 3.8 2.7*  < > 3.1* 3.2*  CL 110 101  < > 98* 100*  CO2 20* 25  < > 29 29  GLUCOSE 132* 154*  < > 132* 145*  BUN 32* 34*  < > 38* 39*  CREATININE 1.04 1.12  < > 1.00 1.00  CALCIUM 7.1* 7.8*  < > 7.4* 7.5*  MG 2.3 2.3  --   --   --   PHOS 5.8* 5.0*  --   --   --   < > =  values in this interval not displayed.  Lipid Panel:     Component Value Date/Time   TRIG 305* 02/06/2016 0500   HgbA1c: No results found for: HGBA1C Urine Drug Screen: No results found for: LABOPIA, COCAINSCRNUR, LABBENZ, AMPHETMU, THCU, LABBARB    IMAGING  Ct Angio Head W/cm &/or Wo Cm  02/06/2016  CLINICAL DATA:  Stroke. EXAM: CT ANGIOGRAPHY HEAD AND NECK TECHNIQUE: Multidetector CT imaging of the head and neck was performed using the standard protocol during bolus administration of intravenous contrast. Multiplanar CT image reconstructions and MIPs were obtained to evaluate the vascular anatomy. Carotid stenosis measurements (when applicable)  are obtained utilizing NASCET criteria, using the distal internal carotid diameter as the denominator. CONTRAST:  50 mL Isovue 370 IV COMPARISON:  MRI head 02/05/2016 FINDINGS: CT HEAD Brain: Large territory subacute infarct in the right MCA territory similar to the prior MRI. This extends into the right occipital lobe in the right PCA territory. Subacute infarct in the left MCA territory over the convexity. This may be a watershed infarct. Subacute infarct left occipital lobe. Multiple areas of subacute infarction in the cerebellum bilaterally. Subacute infarct left caudate. These are all similar to the recent MRI. No associated acute hemorrhage. No shift of the midline structures Calvarium and skull base: Negative Paranasal sinuses: Clear. The patient is intubated. NG tube in the esophagus. Orbits: No orbital lesion. CTA NECK Aortic arch: Aortic arch not imaged. Proximal great vessels are patent with diffuse atherosclerotic calcification. Ground-glass airspace disease bilaterally likely due to mild edema. Right carotid system: Right common carotid artery widely patent. Atherosclerotic calcification of the right carotid bifurcation. 50% diameter stenosis proximal right internal carotid artery. Right external carotid artery widely patent. Left carotid system: Left common carotid artery widely patent. Atherosclerotic calcification left carotid bifurcation. 75% stenosis proximal left internal carotid artery. Mild stenosis external carotid artery on the left. Vertebral arteries:Atherosclerotic calcification is seen scattered throughout both vertebral arteries. Moderately severe calcific stenosis origin right vertebral artery. Mild stenosis origin left vertebral artery. Moderate stenosis distal left vertebral artery due to calcific stenosis. Skeleton: Negative Other neck: Negative for mass or adenopathy in the neck. CTA HEAD Anterior circulation: Extensive atherosclerotic calcification throughout the cavernous carotid  artery bilaterally with moderate to severe stenosis bilaterally in the cavernous carotid. Anterior and middle cerebral arteries patent bilaterally. Decreased flow in right middle cerebral artery branches due to subacute infarction. No filling defect. No large vessel occlusion. Posterior circulation: Moderate stenosis distal left vertebral artery. PICA patent bilaterally. Basilar patent. Superior cerebellar and posterior cerebral arteries patent bilaterally without stenosis. Venous sinuses: Patent Anatomic variants: Negative for cerebral aneurysm. Delayed phase: Normal enhancement postcontrast administration. IMPRESSION: Multiple areas of subacute infarct in both cerebral hemispheres and both cerebellar hemispheres. These are similar in distribution to the recent MRI yesterday. No acute hemorrhage 50% diameter stenosis proximal right internal carotid artery due to calcific stenosis. Moderate to severe stenosis in the right cavernous carotid due to calcific stenosis 75% diameter stenosis proximal left internal carotid artery due to calcific stenosis. Moderate to severe stenosis left cavernous carotid due to calcific stenosis Moderate stenosis at the origin of the right vertebral artery. Moderate stenosis distal left vertebral artery. Scattered atherosclerotic disease throughout both vertebral arteries. No large vessel intracranial occlusion. Decrease flow in the right middle cerebral artery branches due to large territory subacute infarction. Electronically Signed   By: Franchot Gallo M.D.   On: 02/06/2016 17:37   Ct Angio Neck W/cm &/or Wo/cm  02/06/2016  CLINICAL  DATA:  Stroke. EXAM: CT ANGIOGRAPHY HEAD AND NECK TECHNIQUE: Multidetector CT imaging of the head and neck was performed using the standard protocol during bolus administration of intravenous contrast. Multiplanar CT image reconstructions and MIPs were obtained to evaluate the vascular anatomy. Carotid stenosis measurements (when applicable) are obtained  utilizing NASCET criteria, using the distal internal carotid diameter as the denominator. CONTRAST:  50 mL Isovue 370 IV COMPARISON:  MRI head 02/05/2016 FINDINGS: CT HEAD Brain: Large territory subacute infarct in the right MCA territory similar to the prior MRI. This extends into the right occipital lobe in the right PCA territory. Subacute infarct in the left MCA territory over the convexity. This may be a watershed infarct. Subacute infarct left occipital lobe. Multiple areas of subacute infarction in the cerebellum bilaterally. Subacute infarct left caudate. These are all similar to the recent MRI. No associated acute hemorrhage. No shift of the midline structures Calvarium and skull base: Negative Paranasal sinuses: Clear. The patient is intubated. NG tube in the esophagus. Orbits: No orbital lesion. CTA NECK Aortic arch: Aortic arch not imaged. Proximal great vessels are patent with diffuse atherosclerotic calcification. Ground-glass airspace disease bilaterally likely due to mild edema. Right carotid system: Right common carotid artery widely patent. Atherosclerotic calcification of the right carotid bifurcation. 50% diameter stenosis proximal right internal carotid artery. Right external carotid artery widely patent. Left carotid system: Left common carotid artery widely patent. Atherosclerotic calcification left carotid bifurcation. 75% stenosis proximal left internal carotid artery. Mild stenosis external carotid artery on the left. Vertebral arteries:Atherosclerotic calcification is seen scattered throughout both vertebral arteries. Moderately severe calcific stenosis origin right vertebral artery. Mild stenosis origin left vertebral artery. Moderate stenosis distal left vertebral artery due to calcific stenosis. Skeleton: Negative Other neck: Negative for mass or adenopathy in the neck. CTA HEAD Anterior circulation: Extensive atherosclerotic calcification throughout the cavernous carotid artery  bilaterally with moderate to severe stenosis bilaterally in the cavernous carotid. Anterior and middle cerebral arteries patent bilaterally. Decreased flow in right middle cerebral artery branches due to subacute infarction. No filling defect. No large vessel occlusion. Posterior circulation: Moderate stenosis distal left vertebral artery. PICA patent bilaterally. Basilar patent. Superior cerebellar and posterior cerebral arteries patent bilaterally without stenosis. Venous sinuses: Patent Anatomic variants: Negative for cerebral aneurysm. Delayed phase: Normal enhancement postcontrast administration. IMPRESSION: Multiple areas of subacute infarct in both cerebral hemispheres and both cerebellar hemispheres. These are similar in distribution to the recent MRI yesterday. No acute hemorrhage 50% diameter stenosis proximal right internal carotid artery due to calcific stenosis. Moderate to severe stenosis in the right cavernous carotid due to calcific stenosis 75% diameter stenosis proximal left internal carotid artery due to calcific stenosis. Moderate to severe stenosis left cavernous carotid due to calcific stenosis Moderate stenosis at the origin of the right vertebral artery. Moderate stenosis distal left vertebral artery. Scattered atherosclerotic disease throughout both vertebral arteries. No large vessel intracranial occlusion. Decrease flow in the right middle cerebral artery branches due to large territory subacute infarction. Electronically Signed   By: Franchot Gallo M.D.   On: 02/06/2016 17:37   Dg Chest Port 1 View  02/08/2016  CLINICAL DATA:  Hypoxia EXAM: PORTABLE CHEST 1 VIEW COMPARISON:  February 07, 2016 FINDINGS: Endotracheal tube tip is 2.5 cm above the carina. Nasogastric tube tip and side port are in the stomach. Central catheter tip is in the superior vena cava. No pneumothorax. There is atelectatic change in the left base. The lungs elsewhere clear. Heart is upper normal  in size with pulmonary  vascularity within normal limits. No adenopathy. IMPRESSION: Tube and catheter positions as described without pneumothorax. Mild left base atelectasis. Lungs elsewhere clear. Stable cardiac silhouette. Electronically Signed   By: Lowella Grip III M.D.   On: 02/08/2016 07:31   Dg Chest Port 1 View  02/07/2016  CLINICAL DATA:  Respiratory failure. EXAM: PORTABLE CHEST 1 VIEW COMPARISON:  02/06/2016. FINDINGS: Endotracheal tube, NG tube, right IJ line in stable position. Interim clearing of changes of congestive heart failure. Low lung volumes with mild bibasilar atelectasis. No pleural effusion or pneumothorax. IMPRESSION: 1. Lines and tubes in stable position. 2. Interim clearing of changes of congestive heart failure. Low lung volumes with mild bibasilar atelectasis. Electronically Signed   By: Marcello Moores  Register   On: 02/07/2016 07:20       PHYSICAL EXAM Elderly caucasian male who is intubated. Not sedated. . Afebrile. Head is nontraumatic. Neck is supple without bruit.    Cardiac exam no murmur or gallop. Lungs are clear to auscultation. Distal pulses are well felt. Neurological Exam ;  Patient is unresponsive. Eyes are closed. He is intubated. Patient opens eyes partially to sternal rub .he does not follow any commands.. He has slight left gaze and upward gaze deviation. Pupils irregular but reactive. Fundi were not visualized. He does not blink to threat bilaterally. No facial weakness. Tongue midline. Patient is able to move right side to painful stimuli and can withdraw left lower extremity minimally to painful stimuli. No movement in the left upper extremity to pain. Deep tendon flexes are 2+ on the right and 1+ on the left. Left plantar upgoing right is equivocal. Gait was not tested. ASSESSMENT/PLAN Mr. Brian Baxter is a 60 y.o. 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 He had an  emergent bowel resection with wound VAC. He did not receive IV t-PA due to recent stroke.   Recent Cryptogenic Stroke:   bilateral anterior and posterior circulation infarcts, embolic secondary to unknown source  MRI extensive scattered bilateral anterior and posterior circulation infarcts, L basal ganglia lacune, largest R occipital (at [place])  MRA Unremarkable (at [facility])  Carotid Doppler  Neg (at [facility]  2D Echo  EF 60-65%. No source of embolus seen. PFO suspected as contrast shows intraatrial shunt (at [facility])  TEE no SOE, EF 50-55%. No intra-atrial shunt. No PFO seen (at [facility])  LE dopplers negative (at [place])  Repeat LE dopplers ordered  MRI  Extensive supra and infratentorial acute infarcts - R>L MCA and PCA territories. Petechial hemorrhage. 16m R to L shift ([facility])  MR CS  negative  MR thoracic spine small thoracic disc protrusions, o/w normal  MRI angio pelvis negative  CTA head and neck 02/06/16 ;50% diameter stenosis proximal right internal carotid artery due to calcific stenosis. Moderate to severe stenosis in the right cavernous carotid due to calcific stenosis. 75% diameter stenosis proximal left internal carotid artery due to calcific stenosis. Moderate to severe stenosis left cavernous carotid due to calcific stenosis Moderate stenosis at the origin of the right vertebral artery. Moderate stenosis distal left vertebral artery. Scattered atherosclerotic disease throughout both vertebral arteries. No large vessel intracranial occlusion  EEG generalized background slowing, consistent with diffuse cerebral dysfunction. This is a nonspecific finding seen with toxic-metabolic etiologies but also pharmacologic effect from sedation. Repeat 2D echo 02/07/16 : Left ventricle: The cavity size was normal. Systolic function was  normal. The  estimated ejection fraction was in the range of 50%  to 55%. Wall motion was normal; there were no regional wall  motion  abnormalities. Left ventricular diastolic function   parameters were normal. No PFO  ANA negative. Complements slightly elevated  CRP elevated 17.451, RPR neg, NH4 28, Vit B12 550, Vit D 16.5, Fer 421.5, Procalcitonin 0.17, ESR 15, UDS neg,   LDL 130 on 01/26/2016 at [facility]  HgbA1c 5.3 on 01/26/2016 at [facility]  SCDs for VTE prophylaxis Diet NPO time specified .TPN (CLINIMIX-E) Adult  aspirin 81 mg daily prior to admission, now on aspirin 300 mg suppository daily  Ongoing aggressive stroke risk factor management  Therapy recommendations:  pending   Disposition:  pending   Postoperative ventilatory support ? LLL PNA  Hypotension/bradycardia Hx Hypertension  Variable BP - 79/47 ->188/79 past 24h  SBP 130s currently  Permissive hypertension (OK if < 220/120) but gradually normalize in 5-7 days  Long-term BP goal normotensive  Hyperlipidemia  Home meds:  lipitor 80  LDL 130 on 01/26/2016 at [facility], goal < 70  Resume statin when able   Other Stroke Risk Factors  Hx stroke/TIA  01/25/2016 cryptogenic infarcts  Family hx stroke (father)  PVD  ? 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 peritonitis  Recently elevated troponin without reduced cardiac function  Leukocytosis  Anemia of chronic disease  Thrombocytopenia   Elevated liver enzymes, synthetic liver dysfunction  Severe protein calorie malnutrition   Hospital day # 9 I have personally examined this patient, reviewed notes, independently viewed imaging studies, participated in medical decision making and plan of care. I have made any additions or clarifications directly to the above note. He has unfortunately had neurological worsening with now multiple right hemispheric as well as smaller left hemispheric infarcts in  right cerebellar infarcts with a neurological exam being quite poor. The source of patient's multiple cerebral and splenic and probable infarcts is still undetermined despite extensive testing.  Checking transesophageal echocardiogram or cardiac MRI may help in determining cardiac source of embolism but is unlikely to change his prognosis since his neurological exam looks quite poor..  I had a long conference meeting and discussion i  with the patient's wife ,son and other family members and answered questions about his prognosis. I feel patient is unlikely to survive without prolonged ventilatory support and nursing home care and even with that he will have profound neurological damage from his extensive strokes resulting in a very poor quality of life.. Family understands his poor prognosis and are likely to make a decision about goals of care soon. I also discussed with family to consider making him DO NOT RESUSCITATE. We also discussed getting a palliative care consult to better prepare the family to deal with the issue of end-of-life. They will make decisions soon and let us know This patient is critically ill and at significant risk of neurological worsening, death and care requires constant monitoring of vital signs, hemodynamics,respiratory and cardiac monitoring, extensive review of multiple databases, frequent neurological assessment, discussion with family, other specialists and medical decision making of high complexity.I have made any additions or clarifications directly to the above note.This critical care time does not reflect procedure time, or teaching time or supervisory time of PA/NP/Med Resident etc but could involve care discussion time.  I spent 55  minutes of neurocritical care time  in the care of  this patient.      Antony Contras, MD Medical Director [facility] Pager: [phone] 02/08/2016 12:59 PM     To contact Stroke Continuity provider, please refer to  [email or web address] After hours, contact General Neurology

## 2016-02-09 ENCOUNTER — Inpatient Hospital Stay (HOSPITAL_COMMUNITY): Payer: BLUE CROSS/BLUE SHIELD

## 2016-02-09 DIAGNOSIS — I6529 Occlusion and stenosis of unspecified carotid artery: Secondary | ICD-10-CM | POA: Insufficient documentation

## 2016-02-09 DIAGNOSIS — Z7189 Other specified counseling: Secondary | ICD-10-CM | POA: Insufficient documentation

## 2016-02-09 MED ORDER — LORAZEPAM 2 MG/ML IJ SOLN
2.0000 mg | INTRAMUSCULAR | Status: DC | PRN
  Administered 2016-02-09: 2 mg via INTRAVENOUS
  Filled 2016-02-09: qty 1

## 2016-02-09 NOTE — Progress Notes (Signed)
PULMONARY / CRITICAL CARE MEDICINE   Name: Brian Baxter MRN: [id number] DOB: 09-01-1955    ADMISSION DATE:  01/31/2016 CONSULTATION DATE:  01/31/2016  REFERRING MD:  Dr. Donnetta Baxter  CHIEF COMPLAINT:  Abdominal pain  HISTORY OF PRESENT ILLNESS:   60 year old male with hx HTN, tobacco abuse admitted 02/06/2016 in the setting of small bowel ischemia and possible right kidney ischemia. He was taken urgently to OR with gen surgery and vascular, abd left open with wound vac.  Tx to ICU intubated, on pressors.  Previous admission to outside hospital 5/31 where MRI brain noted multiple strokes.  During that admit he had extensive w/u to look for etiology of clot which was ess negative including neg TEE.  Current admit - sedation holiday concerning for pt not following commands, became tachypneic and sedation restarted.   SUBJECTIVE:  Extubated for comfort care.    VITAL SIGNS: BP 112/54 mmHg  Pulse 70  Temp(Src) 98.7 F (37.1 C) (Axillary)  Resp 14  Ht '5\' 9"'$  (1.753 m)  Wt 71.7 kg (158 lb 1.1 oz)  BMI 23.33 kg/m2  SpO2 87%  HEMODYNAMICS: CVP:  [0 mmHg-3 mmHg] 0 mmHg  VENTILATOR SETTINGS: Vent Mode:  [-] PRVC FiO2 (%):  [40 %] 40 % Set Rate:  [14 bmp] 14 bmp Vt Set:  [530 mL] 530 mL PEEP:  [5 cmH20] 5 cmH20 Plateau Pressure:  [12 cmH20] 12 cmH20  INTAKE / OUTPUT: I/O last 3 completed shifts: In: 2235 [I.V.:815; IV Piggyback:400] Out: 2950 [Urine:2750; Emesis/NG output:200]  PHYSICAL EXAMINATION: General:  On vent, NAD, unresponsive Neuro:  RASS -1, does not open eyes, does not follow commands  HEENT:  Normocephalic/atraumatic, endotracheal tube in place Cardiovascular:  Regular rate and rhythm, no murmurs gallops rubs Lungs:  Resps even non labored on vent, clear anterior, diminished bases  Abdomen:  No bowel sounds, abd incision with VAC  Musculoskeletal:  Normal bulk and tone  LABS:  BMET  Recent Labs Lab 02/07/16 1544 02/07/16 2235 02/08/16 0438  NA 137 134* 135   K 2.8* 3.1* 3.2*  CL 98* 98* 100*  CO2 '29 29 29  '$ BUN 36* 38* 39*  CREATININE 1.09 1.00 1.00  GLUCOSE 151* 132* 145*   Electrolytes  Recent Labs Lab 02/04/16 0410  02/06/16 0418 02/07/16 0358 02/07/16 1544 02/07/16 2235 02/08/16 0438  CALCIUM  --   < > 7.1* 7.8* 7.6* 7.4* 7.5*  MG 1.9  --  2.3 2.3  --   --   --   PHOS 2.9  --  5.8* 5.0*  --   --   --   < > = values in this interval not displayed.  CBC  Recent Labs Lab 02/06/16 0418 02/07/16 0358 02/08/16 0438  WBC 19.6* 22.3* 21.6*  HGB 9.0* 10.5* 9.1*  HCT 26.2* 30.7* 28.0*  PLT 42* 62* 68*    Coag's No results for input(s): APTT, INR in the last 168 hours. Sepsis Markers  Recent Labs Lab 02/07/16 0830  LATICACIDVEN 1.3   ABG  Recent Labs Lab 02/06/16 0406 02/07/16 0337 02/07/16 1510  PHART 7.499* 7.602* 7.593*  PCO2ART 30.0* 24.8* 32.9*  PO2ART 89.0 162* 169.0*   Liver Enzymes  Recent Labs Lab 02/06/16 0418 02/07/16 0358 02/08/16 0438  AST 135* 225* 126*  ALT 76* 134* 107*  ALKPHOS 79 114 95  BILITOT 3.5* 3.9* 2.4*  ALBUMIN 1.1* 1.6* 1.5*    Cardiac Enzymes  Recent Labs Lab 02/07/16 0943 02/07/16 1513 02/07/16 2235  TROPONINI 3.34* 3.08*  2.72*   Glucose  Recent Labs Lab 02/07/16 1628 02/07/16 1934 02/07/16 2340 02/08/16 0358 02/08/16 0729 02/08/16 1121  GLUCAP 150* 141* 131* 139* 163* 122*   Imaging No results found.   STUDIES:  01/26/2016 outside hospital TTE: Left atrial enlargement, contrast suggestive of patent foramen ovale 01/26/2016 bilateral carotid ultrasound: No significant stenosis 01/26/2016 ultrasound Doppler legs>  negative for  DVT 01/27/2016 MR angiogram pelvis: Mild compression of the left common iliac vein without significant narrowing or thrombosis 01/27/2016 outside hospital TTE> Normal valves, left atrial enlargement, there is no abnormal contrast to suggest intra-atrial shunting 6/5 CT abdomen OSH> CT angiogram findings worrisome for superior  mesenteric artery clot, small bowel ischemia, infarcts to kidney and spleen. 6/12  EEG >> generalized background slowing, consistent with diffuse cerebral dysfunction, nonspecific finding with toxic-metabolic etiologies, sedation 6/13  ECHO with Bubble Study >>  6/13  UE Venous Duplex >>  6/13  LE Venous Duplex >> 6/13  LE Arterial Duplex >>   CULTURES: None  ANTIBIOTICS: Ertapenem 6/6 >>  Vanc 6/12 >>  SIGNIFICANT EVENTS: 6/05 CTA abd >> worrisome for superior mesenteric artery clot 6/05 OR >> small bowel resection, aortomesenteric bypass  6/09 back to OR >> abd closure   LINES/TUBES: ETT 6/5 >>> TLC R IJ >> L brachial a-line 6/5>>>  DISCUSSION:  60 year old male who has small bowel ischemia, possible renal infarct in the setting of the superior mesenteric artery clot. This occurs several days after recent hospitalization for stroke thought to be embolic.  Uncertain etiology of hypercoagulable state.  Remains critically ill, s/p surgical repair.     ASSESSMENT / PLAN:  Acute respiratory failure  History of tobacco use ? 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  Pt resting comfortably, family at bedside.  Palliative care following   PCCM singing off, please call back if needed.    Nickolas Madrid, NP 02/02/2016  9:29 AM Pager: [phone] or [phone]  Attending Note:  60 year old male with extensive clots diffusely throughout his body, namely one large CVA who has a very poor neurologic prognosis.  Neurology and palliative met with family, decision was made to make patient DNR and full comfort care.  On exam, he appears very comfortable.  Family is bedside.   PCCM will sign off at this point, please call back if needed.  Patient seen and examined, agree with above note.  I dictated the care and orders written for this patient under my direction.  Rush Farmer, MD [phone]

## 2016-02-09 NOTE — Progress Notes (Signed)
Patient ID: Brian BillMichael E Baxter, male   DOB: 10-28-1955, 60 y.o.   MRN: [id number] Mr. Groome is resting comfortably off the bed. Multiple family members present. Respiratory rate in the 13-15 range. Appreciate palliative care assistance. Possible transfer to [address] today. We'll leave this decision to palliative care.

## 2016-02-09 NOTE — Progress Notes (Addendum)
Patient without spontaneous chest rise or pulse on assessment. No heart tones per ascultation. Heart rhythm noted as asystole. Assessment verified with second RN Carrington ClampJamie Sloane Junkin. Patient time of death occurred at 2144.

## 2016-02-09 NOTE — Progress Notes (Signed)
   Oct 23, 2015 1100  Clinical Encounter Type  Visited With Family  Visit Type Follow-up  Referral From Nurse  Spiritual Encounters  Spiritual Needs Emotional  Stress Factors  Family Stress Factors Loss;Major life changes  Chaplain follow up made, family at bedside, prayer offered, pastoral presence provided advised that services are available 24/7 as requested

## 2016-02-09 NOTE — Progress Notes (Signed)
Daily Progress Note   Patient Name: Brian Baxter       Date: 02/21/2016 DOB: Jan 10, 1956  Age: 59 y.o. MRN#: [id number] Attending Physician: Larina Earthly, MD Primary Care Physician: Dina Rich, MD Admit Date: 02/05/2016  Reason for Consultation/Follow-up: Disposition, Non pain symptom management, Pain control and Psychosocial/spiritual support  Subjective: Brian Baxter is lying quietly in bed with his family at bedside.  He is sedated and does not respond to my touch.  His wife, Eber Jones, and 3 sisters are present.  We talk about his symptom management, they share his R has increased over the last 4 hours.  We talk about S/S of discomfort including increased RR and accessory muscle use. They share life stories about Brian Baxter, his work and travels to [place].  I share that they can choose to transfer to [address] [place], where they may be more comfortable. I escort sisters to view this room and they share they feel [facility] [place] would be a better environment for Brian Baxter and the family.   Length of Stay: 10  Current Medications: Scheduled Meds:  . glycopyrrolate  0.4 mg Intravenous QID  . sodium chloride flush  3 mL Intravenous Q12H    Continuous Infusions: . fentaNYL infusion INTRAVENOUS 360 mcg/hr (02/16/2016 1000)  . midazolam (VERSED) infusion 6 mg/hr (02/08/2016 1007)    PRN Meds: acetaminophen **OR** acetaminophen, albuterol, alum & mag hydroxide-simeth, fentaNYL (SUBLIMAZE) injection, hydrALAZINE, metoprolol, midazolam, ondansetron **OR** ondansetron (ZOFRAN) IV, phenol, sodium chloride flush  Physical Exam  Constitutional: No distress.  Sedated, looks comfortable  HENT:  Head: Normocephalic and atraumatic.  Cardiovascular: Normal rate and regular rhythm.   Pulmonary/Chest: Effort normal.  RR 23, mild  work of breathing noted.   Abdominal:  abd with dressing   Neurological:  Sedated for comfort  Skin: Skin is warm and dry.  Nursing note and vitals reviewed.           Vital Signs: BP 79/44 mmHg  Pulse 105  Temp(Src) 98.7 F (37.1 C) (Axillary)  Resp 24  Ht 5\' 9"  (1.753 m)  Wt 71.7 kg (158 lb 1.1 oz)  BMI 23.33 kg/m2  SpO2 63% SpO2: SpO2: (!) 63 % O2 Device: O2 Device: Not Delivered O2 Flow Rate: O2 Flow Rate (L/min): 2 L/min  Intake/output summary:  Intake/Output Summary (Last 24 hours) at 02/06/2016  1055 Last data filed at 02-06-16 1000  Gross per 24 hour  Intake  925.3 ml  Output   1350 ml  Net -424.7 ml   LBM: Last BM Date: 02/06/16 Baseline Weight: Weight: 74.5 kg (164 lb 3.9 oz) Most recent weight: Weight: 71.7 kg (158 lb 1.1 oz)       Palliative Assessment/Data:    Flowsheet Rows        Most Recent Value   Intake Tab    Referral Department  Neurology   Unit at Time of Referral  ICU   Palliative Care Primary Diagnosis  Neurology   Date Notified  02/08/16   Palliative Care Type  New Palliative care   Reason for referral  Clarify Goals of Care   Date of Admission  01/28/2016   Date first seen by Palliative Care  02/08/16   # of days Palliative referral response time  0 Day(s)   # of days IP prior to Palliative referral  9   Clinical Assessment    Psychosocial & Spiritual Assessment    Palliative Care Outcomes       Patient Active Problem List   Diagnosis Date Noted  . DNR (do not resuscitate)   . Palliative care encounter   . Unresponsiveness   . Anasarca   . Respiratory failure (HCC)   . Acute respiratory failure with hypoxemia (HCC)   . Abdominal pain 01/31/2016  . 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 Renal infarct (HCC) 02/08/2016  . Essential hypertension 02/08/2016  . CVA (cerebral vascular accident) (HCC) 01/31/2016  . 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 male with a history of recent stroke on May 31. He was admitted with multiple embolic strokes which was worked up at [facility]. He underwent echo, TEE showing no source of emboli. These were in both the anterior and posterior circulation and in both cerebral hemispheres. They did an MRI pelvis looking for DVT that was not visible otherwise and this was negative. MRA head with only mild atherosclerotic changes of the ICAs. Carotid Dopplers with no stenosis.  Assessment: As above  Recommendations/Plan:  Full comfort care.  Fentanyl and versed via continuous infusion, with PRN bolus.   Goals of Care and Additional Recommendations:  Limitations on Scope of Treatment: Full Comfort Care  Code Status:    Code Status Orders        Start     Ordered   02/08/16 1224  Do not attempt resuscitation (DNR)   Continuous    Question Answer Comment  In the event of cardiac or respiratory ARREST Do not call a "code blue"   In the event of cardiac or respiratory ARREST Do not perform Intubation, CPR, defibrillation or ACLS   In the event of cardiac or respiratory ARREST Use medication by any route, position, wound care, and other measures to relive pain and suffering. May use oxygen, suction and manual treatment of airway obstruction as needed for comfort.      02/08/16 1230    Code Status History    Date Active Date Inactive Code Status Order ID Comments User Context   01/29/2016  9:47 PM 02/08/2016 12:30 PM Full Code [id number]  Therisa DoyneAnastassia Doutova, MD Inpatient       Prognosis:   Hours - Days  Discharge Planning:  Anticipated Hospital Death  Care plan was discussed with nursing  staff, Dr. Arbie Cookey.   Thank you for allowing the Palliative Medicine Team to assist in the care of this patient.   Time In: 0930 Time Out: 1020 Total Time 50 minutes Prolonged Time Billed  yes       Greater than 50%  of this time was spent counseling and  coordinating care related to the above assessment and plan.  Dove,Tasha A, NP  Please contact Palliative Medicine Team phone at [phone] for questions and concerns.

## 2016-02-09 NOTE — Progress Notes (Addendum)
STROKE TEAM PROGRESS NOTE   HISTORY OF PRESENT ILLNESS (per record) Brian Baxter is a 60 y.o. male with a history of recent stroke on May 31. He was admitted with multiple embolic strokes which was worked up at [facility]. He underwent echo, TEE showing no source of emboli. These were in both the anterior and posterior circulation and in both cerebral hemispheres. They did an MRI pelvis looking for DVT that was not visible otherwise and this was negative. MRA head with only mild atherosclerotic changes of the ICAs. Carotid Dopplers with no stenosis. LDL 134  The largest was noted to be 2.4 x 4.2 cm the right occipital lobe. He then went home and presented to [facility] with abdominal pain. 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 He was transferred to [facility] for IV TPA administration, but given his recent CVA that was not possible. He was seen by Dr. early of vascular surgery who performed bowel resection with a wound VAC left in place.  Given his open abdomen, he was heavily sedated following the procedure. On lightning sedation, however he has been noted to not be waking up. Given his recent embolic infarcts, neurology has been consulted for further evaluation.  His LKW for this presentation is unclear. Patient was not administered IV t-PA secondary to recent stroke. He was admitted post op for further evaluation and treatment.   SUBJECTIVE (INTERVAL HISTORY) Patient  Was made comfort care yesterday after his discussion with palliative care and was extubated yesterday evening. He is resting peacefully at present on morphine drip. Wife and other family members are at the bedside.  OBJECTIVE Pulse Rate:  [59-105] 105 (06/15 1000) Cardiac Rhythm:  [-] Normal sinus rhythm (06/15 0800) Resp:  [13-24] 24 (06/15 1000) BP: (67-79)/(43-44) 79/44 mmHg (06/15 0956) SpO2:  [58 %-92 %] 63 % (06/15 1000) Arterial Line BP:  (103-106)/(40-42) 106/40 mmHg (06/14 1500)  CBC:   Recent Labs Lab 02/06/16 0418 02/07/16 0358 02/08/16 0438  WBC 19.6* 22.3* 21.6*  NEUTROABS 16.2*  --   --   HGB 9.0* 10.5* 9.1*  HCT 26.2* 30.7* 28.0*  MCV 87.0 86.5 90.3  PLT 42* 62* 68*    Basic Metabolic Panel:   Recent Labs Lab 02/06/16 0418 02/07/16 0358  02/07/16 2235 02/08/16 0438  NA 138 138  < > 134* 135  K 3.8 2.7*  < > 3.1* 3.2*  CL 110 101  < > 98* 100*  CO2 20* 25  < > 29 29  GLUCOSE 132* 154*  < > 132* 145*  BUN 32* 34*  < > 38* 39*  CREATININE 1.04 1.12  < > 1.00 1.00  CALCIUM 7.1* 7.8*  < > 7.4* 7.5*  MG 2.3 2.3  --   --   --   PHOS 5.8* 5.0*  --   --   --   < > = values in this interval not displayed.  Lipid Panel:     Component Value Date/Time   TRIG 305* 02/06/2016 0500   HgbA1c: No results found for: HGBA1C Urine Drug Screen: No results found for: LABOPIA, COCAINSCRNUR, LABBENZ, AMPHETMU, THCU, LABBARB    IMAGING  Dg Chest Port 1 View  02/08/2016  CLINICAL DATA:  Hypoxia EXAM: PORTABLE CHEST 1 VIEW COMPARISON:  February 07, 2016 FINDINGS: Endotracheal tube tip is 2.5 cm above the carina. Nasogastric tube tip and side port are in the stomach. Central catheter tip is in the superior  vena cava. No pneumothorax. There is atelectatic change in the left base. The lungs elsewhere clear. Heart is upper normal in size with pulmonary vascularity within normal limits. No adenopathy. IMPRESSION: Tube and catheter positions as described without pneumothorax. Mild left base atelectasis. Lungs elsewhere clear. Stable cardiac silhouette. Electronically Signed   By: Lowella Grip III M.D.   On: 02/08/2016 07:31       PHYSICAL EXAM Elderly caucasian male who is  Sedated on morphine drip. . Afebrile. Head is nontraumatic. Neck is supple without bruit.    Cardiac exam no murmur or gallop. Lungs are clear to auscultation. Distal pulses are well felt. Neurological Exam ;  Patient is unresponsive. Eyes are  closed  Patient does not open eyes to sternal rub .He does not follow any commands.. He has slight left gaze and upward gaze deviation. Pupils irregular but reactive. Fundi were not visualized.  No facial weakness. Tongue midline. Patient is able to move right side to painful stimuli and can withdraw left lower extremity minimally to painful stimuli. No movement in the left upper extremity to pain. Deep tendon flexes are 2+ on the right and 1+ on the left. Left plantar upgoing right is equivocal. Gait was not tested. ASSESSMENT/PLAN Brian Baxter is a 60 y.o. 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 He had an emergent bowel resection with wound VAC. He did not receive IV t-PA due to recent stroke.   Recent Cryptogenic Stroke:   bilateral anterior and posterior circulation infarcts, embolic secondary to unknown source  MRI extensive scattered bilateral anterior and posterior circulation infarcts, L basal ganglia lacune, largest R occipital (at [place])  MRA Unremarkable (at [facility])  Carotid Doppler  Neg (at [facility])  2D Echo  EF 60-65%. No source of embolus seen. PFO suspected as contrast shows intraatrial shunt (at [facility])  TEE no SOE, EF 50-55%. No intra-atrial shunt. No PFO seen (at [facility])  LE dopplers negative (at [place])  Repeat LE dopplers ordered  MRI  Extensive supra and infratentorial acute infarcts - R>L MCA and PCA territories. Petechial hemorrhage. 23m R to L shift ([facility])  MR CS  negative  MR thoracic spine small thoracic disc protrusions, o/w normal  MRI angio pelvis negative  CTA head and neck 02/06/16 ;50% diameter stenosis proximal right internal carotid artery due to calcific stenosis. Moderate to severe stenosis in the right cavernous carotid due to calcific stenosis. 75% diameter stenosis proximal left internal carotid artery due to calcific  stenosis. Moderate to severe stenosis left cavernous carotid due to calcific stenosis Moderate stenosis at the origin of the right vertebral artery. Moderate stenosis distal left vertebral artery. Scattered atherosclerotic disease throughout both vertebral arteries. No large vessel intracranial occlusion  EEG generalized background slowing, consistent with diffuse cerebral dysfunction. This is a nonspecific finding seen with toxic-metabolic etiologies but also pharmacologic effect from sedation. Repeat 2D echo 02/07/16 : Left ventricle: The cavity size was normal. Systolic function was  normal. The estimated ejection fraction was in the range of 50%  to 55%. Wall motion was normal; there were no regional wall  motion abnormalities. Left ventricular diastolic function   parameters were normal. No PFO  ANA negative. Complements slightly elevated  CRP elevated 17.451, RPR neg, NH4 28, Vit B12 550, Vit D 16.5, Fer 421.5, Procalcitonin 0.17, ESR 15, UDS neg,   [facility] HgbA1c 5.3 on 01/26/2016  at [facility]  SCDs for VTE prophylaxis Diet NPO time specified  aspirin 81 mg daily prior to admission, now on aspirin 300 mg suppository daily  Ongoing aggressive stroke risk factor management  Therapy recommendations:  pending   Disposition:  pending   Postoperative ventilatory support ? LLL PNA  Hypotension/bradycardia Hx Hypertension  Variable BP - 79/47 ->188/79 past 24h  SBP 130s currently  Permissive hypertension (OK if < 220/120) but gradually normalize in 5-7 days  Long-term BP goal normotensive  Hyperlipidemia  Home meds:  lipitor 80  LDL 130 on 01/26/2016 at [facility], goal < 70  Resume statin when able   Other Stroke Risk Factors  Hx stroke/TIA  01/25/2016 cryptogenic infarcts  Family hx stroke (father)  PVD  ? PFO - on TEE at [facility] not confirmed on TTE at [facility] on 02/07/16  Other Active Problems  Acute mesenteric artery thrombosis,  B renal and splenic infarcts resulting in bowel ischemia s/p aortomesenteric bypass w/ open wound, VAC. peritonitis  Recently elevated troponin without reduced cardiac function  Leukocytosis  Anemia of chronic disease  Thrombocytopenia   Elevated liver enzymes, synthetic liver dysfunction  Severe protein calorie malnutrition   Hospital day # 10 I have personally examined this patient, reviewed notes, independently viewed imaging studies, participated in medical decision making and plan of care. I have made any additions or clarifications directly to the above note. He has unfortunately had neurological worsening with now multiple right hemispheric as well as smaller left hemispheric infarcts in right cerebellar infarcts with a neurological exam being quite poor. The source of patient's multiple cerebral and splenic and probable infarcts is still undetermined despite extensive testing. The patient has been made DO NOT RESUSCITATE and palliative care and I am in agreement with the plan. The wife and family were given opportunity to ask questions which were answered. Stroke team will sign off at the current time. Kindly call for questions if any.       Antony Contras, MD Medical Director [place] Pager: [phone] 01/28/2016 11:50 PM     To contact Stroke Continuity provider, please refer to [email or web address] After hours, contact General Neurology

## 2016-02-14 NOTE — Anesthesia Postprocedure Evaluation (Signed)
Anesthesia Post Note  Patient: Brian Baxter  Procedure(s) Performed: Procedure(s) (LRB): INFRARENAL AORTA-SUPERIOR MESENTERIC ARTERY BYPASS USING LEFT GREATER SAPHENOUS VEIN (N/A) EXPLORATORY LAPAROTOMY (N/A)  SMALL BOWEL RESECTION (N/A) APPLICATION OF WOUND VAC (N/A)  Anesthetic complications: no Comments: Pt remained intubated s/p bowel resection, and entered palliative care.  He is now deceased.    Last Vitals:  Filed Vitals:   02/17/2016 2035 02/06/2016 2100  BP:    Pulse: 26   Temp:    Resp:  18    Last Pain:  Filed Vitals:   02/17/2016 2300  PainSc: Asleep                 Heitor Steinhoff,E. Randilyn Foisy

## 2016-02-25 NOTE — Discharge Summary (Signed)
[facility] Death Summary  Regis BillMichael E Morden 12-25-1955 60 y.o. 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 male who presented with mesenteric ischemia. He had been in his usual state of health when he had a stroke. [facility] was [facility]2016 where MRI showed bilateral anterior and posterior circulation strokes. Workup including transesophageal echo showed no evidence of embolic source. He was discharged home on aspirin. He presented [facility] yesterday complaining of worsening abdominal pain. CT scan today revealed infarcts to his left kidney and his spleen. 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 The plan had been to transfer [facility] for interventional radiology for lysis of the SMA occlusion. When it was discovered that he had the recent stroke vascular surgery was consult. The patient denies any prior history of chronic mesenteric ischemia symptoms such as postprandial pain or abdominal pain. Does report a possible calf claudication reports that his calves get tired with walking. No prior cardiac history.  Hospital Course:  The patient was admitted to the hospital on 02/18/2016 and emergent exploratory laparotomy, resection of distal ileum and abdominal VAC placement. Gen. surgeon Dr. Janee Mornhompson assisted with the procedure.   The patient was taken back to the ICU intubated and critically ill.  Postop day 1, the patient was sedated on the vent. He was moving all 4 extremities. However, given sedation was difficult to assess neurologic function. His VAC was functioning well. Plans are made to return to the OR the following day for second look exploratory laparotomy and possible mesenteric  revascularization.  The patient was taken back to the operating room on postop day 2 and underwent second look exploratory laparotomy followed by infrarenal aorta to superior mesenteric artery bypass with saphenous vein and superior mesenteric artery thrombectomy. There was non-viable bowel and Dr.Cornett resected approximately 2 feet of ileum. An abdominal VAC was replaced. The patient was taken back to the ICU intubated in critical condition.  The patient was stable with palpable popliteal pulses bilaterally. He was on Neo-Synephrine for blood pressure support. He was sedated on the ventilator. Plans are made to return to the OR tomorrow to relook at the intestines and hopefully abdominal closure. The patient's hypercoagulable panel was pending.  The patient was taken back to the operating room on 02/14/2016 for exploratory laparotomy with small bowel resection and creation of a side to side small bowel anastomosis with closure of the abdomen. He was transported back to the ICU intubated in critical condition.  The patient was tolerating spontaneous breathing trials on 02/05/2016 but remained confused. He was not following commands. Neurology was consulted given recent CVA. There were concerns for further embolic ischemic events of the brain as well as the spinal cord. An MRI of the brain, cervical spine and thoracic spine were ordered. The patient had neurological worsening with new multiple right hemispheric and smaller left hemispheric infarcts. Neurology recommended repeating transthoracic echo and CTA brain and neck, lower extremity venous Dopplers and vasculitic labs.  Repeat carotid duplex revealed less than 40% internal carotid artery stenosis bilaterally. It was felt that the the etiology of his strokes was likely an embolism from an unidentified source. TTE showed normal ejection fraction without obvious cardiac source of embolism.  Palliative was consult to  discuss with family regarding poor  prognosis. Patient remained unresponsive on the vent. It was felt that the patient would not likely survive without prolonged ventilatory support. The family decided to pursue comfort care on 02/08/2016. The patient passed away the following day on 03-29-16 at 2144.  CBC    Component Value Date/Time   WBC 21.6* 02/08/2016 0438   RBC 3.10* 02/08/2016 0438   HGB 9.1* 02/08/2016 0438   HCT 28.0* 02/08/2016 0438   PLT 68* 02/08/2016 0438   MCV 90.3 02/08/2016 0438   MCH 29.4 02/08/2016 0438   MCHC 32.5 02/08/2016 0438   RDW 15.2 02/08/2016 0438   LYMPHSABS 1.2 02/06/2016 0418   MONOABS 2.2* 02/06/2016 0418   EOSABS 0.0 02/06/2016 0418   BASOSABS 0.0 02/06/2016 0418    BMET    Component Value Date/Time   NA 135 02/08/2016 0438   K 3.2* 02/08/2016 0438   CL 100* 02/08/2016 0438   CO2 29 02/08/2016 0438   GLUCOSE 145* 02/08/2016 0438   BUN 39* 02/08/2016 0438   CREATININE 1.00 02/08/2016 0438   CALCIUM 7.5* 02/08/2016 0438   GFRNONAA >60 02/08/2016 0438   GFRAA >60 02/08/2016 0438     Discharge Diagnosis:  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 Goals of care, counseling/discussion   . Carotid stenosis   . DNR (do not resuscitate)   . Palliative care encounter   . Unresponsiveness   . Anasarca   . Respiratory failure (HCC)   . Acute respiratory failure with hypoxemia (HCC)   . Abdominal pain 01/31/2016  . 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 Renal infarct (HCC) 01/31/2016  . Essential hypertension 02/07/2016  . CVA (cerebral vascular accident) (HCC) 01/29/2016  . 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 Hypertension   . TIA (transient ischemic attack) june 2017  . Renal infarct (HCC) 02/20/2016  . CVA (cerebral infarction)   . 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     atorvastatin 80 MG tablet  Commonly known as:  LIPITOR  Take 80 mg by mouth daily.     meclizine 32 MG tablet  Commonly known as:  ANTIVERT  Take 32 mg by mouth 3 (three) times daily as needed.     pantoprazole 40 MG tablet  Commonly known as:  PROTONIX  Take 40 mg by mouth daily.     TRIBENZOR 40-10-12.5 MG Tabs  Generic drug:  Olmesartan-Amlodipine-HCTZ  Take by mouth every morning.        Patient's condition: is deceased  Maris BergerKimberly Tabari Volkert, [place]PA-C [facility] [phone] 02/21/2016  1:23 PM

## 2016-02-25 NOTE — Progress Notes (Signed)
230 ml fentanyl and 25 ml versed wasted in sink. Witnessed by second RN Carrington ClampJamie Warlick.

## 2016-02-25 DEATH — deceased
